# Patient Record
Sex: Female | Born: 1955 | Race: White | Hispanic: No | Marital: Single | Smoking: Former smoker
Health system: Southern US, Community
[De-identification: ages and names within clinical notes are randomized; demographics above are authoritative.]

## PROBLEM LIST (undated history)

## (undated) DIAGNOSIS — M1990 Unspecified osteoarthritis, unspecified site: Secondary | ICD-10-CM

## (undated) DIAGNOSIS — Z8719 Personal history of other diseases of the digestive system: Secondary | ICD-10-CM

## (undated) DIAGNOSIS — IMO0002 Reserved for concepts with insufficient information to code with codable children: Secondary | ICD-10-CM

## (undated) DIAGNOSIS — F32A Depression, unspecified: Secondary | ICD-10-CM

## (undated) DIAGNOSIS — I7410 Embolism and thrombosis of unspecified parts of aorta: Secondary | ICD-10-CM

## (undated) DIAGNOSIS — F329 Major depressive disorder, single episode, unspecified: Secondary | ICD-10-CM

## (undated) DIAGNOSIS — I10 Essential (primary) hypertension: Secondary | ICD-10-CM

## (undated) DIAGNOSIS — J40 Bronchitis, not specified as acute or chronic: Secondary | ICD-10-CM

## (undated) DIAGNOSIS — K219 Gastro-esophageal reflux disease without esophagitis: Secondary | ICD-10-CM

## (undated) DIAGNOSIS — D509 Iron deficiency anemia, unspecified: Secondary | ICD-10-CM

## (undated) DIAGNOSIS — G43909 Migraine, unspecified, not intractable, without status migrainosus: Secondary | ICD-10-CM

## (undated) DIAGNOSIS — F419 Anxiety disorder, unspecified: Secondary | ICD-10-CM

## (undated) DIAGNOSIS — T7840XA Allergy, unspecified, initial encounter: Secondary | ICD-10-CM

## (undated) DIAGNOSIS — D734 Cyst of spleen: Secondary | ICD-10-CM

## (undated) DIAGNOSIS — S3600XA Unspecified injury of spleen, initial encounter: Secondary | ICD-10-CM

## (undated) DIAGNOSIS — Z86718 Personal history of other venous thrombosis and embolism: Secondary | ICD-10-CM

## (undated) DIAGNOSIS — D473 Essential (hemorrhagic) thrombocythemia: Secondary | ICD-10-CM

## (undated) HISTORY — DX: Essential (hemorrhagic) thrombocythemia: D47.3

## (undated) HISTORY — DX: Iron deficiency anemia, unspecified: D50.9

---

## 2000-09-20 ENCOUNTER — Encounter: Admission: RE | Admit: 2000-09-20 | Discharge: 2000-09-20 | Payer: Self-pay | Admitting: Family Medicine

## 2000-09-20 ENCOUNTER — Encounter: Payer: Self-pay | Admitting: Family Medicine

## 2003-09-22 ENCOUNTER — Encounter: Admission: RE | Admit: 2003-09-22 | Discharge: 2003-09-22 | Payer: Self-pay | Admitting: Family Medicine

## 2007-03-27 ENCOUNTER — Encounter: Admission: RE | Admit: 2007-03-27 | Discharge: 2007-03-27 | Payer: Self-pay | Admitting: Family Medicine

## 2007-06-07 ENCOUNTER — Emergency Department (HOSPITAL_COMMUNITY): Admission: EM | Admit: 2007-06-07 | Discharge: 2007-06-08 | Payer: Self-pay | Admitting: Emergency Medicine

## 2007-09-18 ENCOUNTER — Ambulatory Visit (HOSPITAL_COMMUNITY): Admission: RE | Admit: 2007-09-18 | Discharge: 2007-09-18 | Payer: Self-pay | Admitting: Gastroenterology

## 2009-04-20 HISTORY — PX: KNEE ARTHROSCOPY: SHX127

## 2010-09-09 ENCOUNTER — Encounter: Payer: Self-pay | Admitting: Family Medicine

## 2010-09-10 ENCOUNTER — Encounter: Payer: Self-pay | Admitting: Family Medicine

## 2011-08-21 DIAGNOSIS — S3600XA Unspecified injury of spleen, initial encounter: Secondary | ICD-10-CM

## 2011-08-21 DIAGNOSIS — J40 Bronchitis, not specified as acute or chronic: Secondary | ICD-10-CM

## 2011-08-21 DIAGNOSIS — IMO0002 Reserved for concepts with insufficient information to code with codable children: Secondary | ICD-10-CM

## 2011-08-21 DIAGNOSIS — I7410 Embolism and thrombosis of unspecified parts of aorta: Secondary | ICD-10-CM

## 2011-08-21 DIAGNOSIS — Z86718 Personal history of other venous thrombosis and embolism: Secondary | ICD-10-CM

## 2011-08-21 HISTORY — DX: Unspecified injury of spleen, initial encounter: S36.00XA

## 2011-08-21 HISTORY — DX: Personal history of other venous thrombosis and embolism: Z86.718

## 2011-08-21 HISTORY — DX: Bronchitis, not specified as acute or chronic: J40

## 2011-08-21 HISTORY — DX: Embolism and thrombosis of unspecified parts of aorta: I74.10

## 2011-08-21 HISTORY — DX: Reserved for concepts with insufficient information to code with codable children: IMO0002

## 2011-08-28 ENCOUNTER — Emergency Department (HOSPITAL_COMMUNITY): Payer: BC Managed Care – PPO

## 2011-08-28 ENCOUNTER — Emergency Department (HOSPITAL_COMMUNITY)
Admission: EM | Admit: 2011-08-28 | Discharge: 2011-08-28 | Disposition: A | Discharge status: Home / Self Care | Payer: BC Managed Care – PPO | Attending: Emergency Medicine | Admitting: Emergency Medicine

## 2011-08-28 DIAGNOSIS — E871 Hypo-osmolality and hyponatremia: Secondary | ICD-10-CM

## 2011-08-28 DIAGNOSIS — R6883 Chills (without fever): Secondary | ICD-10-CM | POA: Insufficient documentation

## 2011-08-28 DIAGNOSIS — R5381 Other malaise: Secondary | ICD-10-CM | POA: Insufficient documentation

## 2011-08-28 DIAGNOSIS — Z79899 Other long term (current) drug therapy: Secondary | ICD-10-CM | POA: Insufficient documentation

## 2011-08-28 DIAGNOSIS — R51 Headache: Secondary | ICD-10-CM | POA: Insufficient documentation

## 2011-08-28 DIAGNOSIS — R739 Hyperglycemia, unspecified: Secondary | ICD-10-CM

## 2011-08-28 DIAGNOSIS — E119 Type 2 diabetes mellitus without complications: Secondary | ICD-10-CM | POA: Insufficient documentation

## 2011-08-28 DIAGNOSIS — R5383 Other fatigue: Secondary | ICD-10-CM | POA: Insufficient documentation

## 2011-08-28 DIAGNOSIS — J3489 Other specified disorders of nose and nasal sinuses: Secondary | ICD-10-CM | POA: Insufficient documentation

## 2011-08-28 DIAGNOSIS — R05 Cough: Secondary | ICD-10-CM | POA: Insufficient documentation

## 2011-08-28 DIAGNOSIS — M129 Arthropathy, unspecified: Secondary | ICD-10-CM | POA: Insufficient documentation

## 2011-08-28 DIAGNOSIS — R1110 Vomiting, unspecified: Secondary | ICD-10-CM | POA: Insufficient documentation

## 2011-08-28 DIAGNOSIS — R062 Wheezing: Secondary | ICD-10-CM | POA: Insufficient documentation

## 2011-08-28 DIAGNOSIS — R000 Tachycardia, unspecified: Secondary | ICD-10-CM | POA: Insufficient documentation

## 2011-08-28 DIAGNOSIS — E669 Obesity, unspecified: Secondary | ICD-10-CM | POA: Insufficient documentation

## 2011-08-28 DIAGNOSIS — R059 Cough, unspecified: Secondary | ICD-10-CM | POA: Insufficient documentation

## 2011-08-28 DIAGNOSIS — R0989 Other specified symptoms and signs involving the circulatory and respiratory systems: Secondary | ICD-10-CM | POA: Insufficient documentation

## 2011-08-28 DIAGNOSIS — J329 Chronic sinusitis, unspecified: Secondary | ICD-10-CM | POA: Insufficient documentation

## 2011-08-28 DIAGNOSIS — R0609 Other forms of dyspnea: Secondary | ICD-10-CM | POA: Insufficient documentation

## 2011-08-28 DIAGNOSIS — J40 Bronchitis, not specified as acute or chronic: Secondary | ICD-10-CM | POA: Insufficient documentation

## 2011-08-28 HISTORY — DX: Migraine, unspecified, not intractable, without status migrainosus: G43.909

## 2011-08-28 HISTORY — DX: Unspecified osteoarthritis, unspecified site: M19.90

## 2011-08-28 LAB — BASIC METABOLIC PANEL
CO2: 20 mEq/L (ref 19–32)
Chloride: 94 mEq/L — ABNORMAL LOW (ref 96–112)
Creatinine, Ser: 1.07 mg/dL (ref 0.50–1.10)
GFR calc Af Amer: 67 mL/min — ABNORMAL LOW (ref >90)
Potassium: 4.2 mEq/L (ref 3.5–5.1)
Sodium: 127 mEq/L — ABNORMAL LOW (ref 135–145)

## 2011-08-28 LAB — URINALYSIS, ROUTINE W REFLEX MICROSCOPIC
Bilirubin Urine: NEGATIVE
Glucose, UA: 100 mg/dL — AB
Ketones, ur: NEGATIVE mg/dL
Nitrite: NEGATIVE
Specific Gravity, Urine: 1.015 (ref 1.005–1.030)
pH: 5 (ref 5.0–8.0)

## 2011-08-28 LAB — DIFFERENTIAL
Basophils Absolute: 0 10*3/uL (ref 0.0–0.1)
Eosinophils Relative: 7 % — ABNORMAL HIGH (ref 0–5)
Lymphocytes Relative: 39 % (ref 12–46)
Lymphs Abs: 2.1 10*3/uL (ref 0.7–4.0)
Monocytes Absolute: 0.4 10*3/uL (ref 0.1–1.0)
Monocytes Relative: 7 % (ref 3–12)
Neutro Abs: 2.6 10*3/uL (ref 1.7–7.7)

## 2011-08-28 LAB — CBC
HCT: 42.5 % (ref 36.0–46.0)
Hemoglobin: 15.6 g/dL — ABNORMAL HIGH (ref 12.0–15.0)
MCV: 85.3 fL (ref 78.0–100.0)
RBC: 4.98 MIL/uL (ref 3.87–5.11)
WBC: 5.4 10*3/uL (ref 4.0–10.5)

## 2011-08-28 LAB — GLUCOSE, CAPILLARY: Glucose-Capillary: 294 mg/dL — ABNORMAL HIGH (ref 70–99)

## 2011-08-28 MED ORDER — ALBUTEROL SULFATE HFA 108 (90 BASE) MCG/ACT IN AERS: 2.0000 | INHALATION_SPRAY | RESPIRATORY_TRACT | Status: DC | PRN

## 2011-08-28 MED ORDER — ALBUTEROL SULFATE (5 MG/ML) 0.5% IN NEBU
2.5000 mg | INHALATION_SOLUTION | Freq: Once | RESPIRATORY_TRACT | Status: AC
  Administered 2011-08-28: 2.5 mg via RESPIRATORY_TRACT
  Filled 2011-08-28: qty 0.5

## 2011-08-28 MED ORDER — SODIUM CHLORIDE 0.9 % IV SOLN
Freq: Once | INTRAVENOUS | Status: AC
  Administered 2011-08-28: 17:00:00 via INTRAVENOUS

## 2011-08-28 NOTE — ED Notes (Signed)
Pt. Was sent to  [place] from [facility] clinic for dehydration and elevated blood sugars, also for n/v

## 2011-08-28 NOTE — ED Provider Notes (Signed)
History     CSN: [id number]  Arrival date & time 08/28/11  1341   First MD Initiated Contact with Patient 08/28/11 1558      Chief Complaint  Patient presents with  . Hyperglycemia    (Consider location/radiation/quality/duration/timing/severity/associated sxs/prior treatment) The history is provided by the patient.   56 year old female has been sick for the last 3 weeks with a cough which is mainly nonproductive and nasal congestion. She has not had any fever but she has had some chills. She's had posttussive emesis. During this time, she is so afraid fatigued and weak and she has noted some exertional dyspnea. She denies any nausea she denies vomiting without coughing paroxysms. Blood sugars have been running higher than normal during this time. She received a prescription for Bactrim which she has been taking for last 8 days with no relief. She went to her physician today and blood sugar was noted to be elevated and she was sent to the ED for IV fluids and a blood sugar control. Symptoms are moderate to severe. Nothing makes them better and nothing makes them worse. She arrived with paperwork from her physician's office and she had been written a prescription for Levaquin 750 mg for 5 days.  Past Medical History  Diagnosis Date  . Diabetes mellitus   . Arthritis   . Migraines     History reviewed. No pertinent past surgical history.  No family history on file.  History  Substance Use Topics  . Smoking status: Never Smoker   . Smokeless tobacco: Never Used  . Alcohol Use: No    OB History    Grav Para Term Preterm Abortions TAB SAB Ect Mult Living                  Review of Systems  All other systems reviewed and are negative.    Allergies  Codeine; Erythromycin; Lactose intolerance (gi); and Penicillins  Home Medications   Current Outpatient Rx  Name Route Sig Dispense Refill  . ALPRAZOLAM 0.5 MG PO TABS Oral Take 0.25 mg by mouth 2 (two) times daily as needed.  For anxiety     . BUPROPION HCL 75 MG PO TABS Oral Take 75 mg by mouth 2 (two) times daily.      . CHOLESTYRAMINE 4 GM/DOSE PO POWD Oral Take 4 g by mouth every morning.      Marland Kitchen DM-GUAIFENESIN ER 30-600 MG PO TB12 Oral Take 1 tablet by mouth every 12 (twelve) hours as needed. For cough     . ESOMEPRAZOLE MAGNESIUM 40 MG PO CPDR Oral Take 40 mg by mouth 2 (two) times daily.      Marland Kitchen GLIPIZIDE ER 2.5 MG PO TB24 Oral Take 2.5-5 mg by mouth 2 (two) times daily. 5 mg in the morning and 2.5 mg at night     . LISINOPRIL-HYDROCHLOROTHIAZIDE 20-12.5 MG PO TABS Oral Take 1 tablet by mouth every morning.      Marland Kitchen LOPERAMIDE HCL 2 MG PO CAPS Oral Take 2 mg by mouth once.      Marland Kitchen MEDROXYPROGESTERONE ACETATE 150 MG/ML IM SUSP Intramuscular Inject 150 mg into the muscle every 3 (three) months.      Marland Kitchen OVER THE COUNTER MEDICATION Both Eyes Place 2 drops into both eyes daily as needed. Saline drop for dry eyes     . SULFAMETHOXAZOLE-TMP DS 800-160 MG PO TABS Oral Take 1 tablet by mouth 2 (two) times daily. For 12 days - started 12/31     .  TRAZODONE HCL 100 MG PO TABS Oral Take 100 mg by mouth at bedtime.      . VENLAFAXINE HCL ER 75 MG PO CP24 Oral Take 75 mg by mouth every evening.        BP 117/70  Pulse 103  Temp(Src) 98.4 F (36.9 C) (Oral)  Resp 20  Ht 5\' 6"  (1.676 m)  Wt 238 lb (107.956 kg)  BMI 38.41 kg/m2  SpO2 95%  Physical Exam  Nursing note and vitals reviewed.  56 year old female is resting comfortably and in no acute distress. No signs show mild tachycardia with heart rate 103. Oxygen saturation is 95% which is normal. She is a mildly obese. Head is normocephalic and atraumatic. PERRLA, EOMI. There is moderate tenderness palpation over frontal maxillary sinuses. Mucous membranes are dry. Pharynx is clear. Neck is supple without adenopathy or JVD. Back is nontender. Lungs have scattered wheezes and a wheezy cough present. No rales or rhonchi are heard. Heart has regular rate rhythm without murmur.  There is no chest wall tenderness. Abdomen is soft, flat, nontender without masses or hepatosplenomegaly. Extremities have no cyanosis or edema, full range of motion is present. Skin is warm and moist without rash. Neurologic: Mental status is normal, cranial nerves are intact, there no focal motor or sensory deficits. Psychiatric: No abnormalities of mood or affect.  ED Course  Procedures (including critical care time)  Labs Reviewed  GLUCOSE, CAPILLARY - Abnormal; Notable for the following:    Glucose-Capillary 294 (*)    All other components within normal limits  POCT CBG MONITORING  BASIC METABOLIC PANEL  CBC  DIFFERENTIAL  URINALYSIS, ROUTINE W REFLEX MICROSCOPIC   No results found. Results for orders placed during the hospital encounter of 08/28/11  GLUCOSE, CAPILLARY      Component Value Range   Glucose-Capillary 294 (*) 70 - 99 (mg/dL)  BASIC METABOLIC PANEL      Component Value Range   Sodium 127 (*) 135 - 145 (mEq/L)   Potassium 4.2  3.5 - 5.1 (mEq/L)   Chloride 94 (*) 96 - 112 (mEq/L)   CO2 20  19 - 32 (mEq/L)   Glucose, Bld 245 (*) 70 - 99 (mg/dL)   BUN 17  6 - 23 (mg/dL)   Creatinine, Ser 4.09  0.50 - 1.10 (mg/dL)   Calcium 8.8  8.4 - 81.1 (mg/dL)   GFR calc non Af Amer 58 (*) >90 (mL/min)   GFR calc Af Amer 67 (*) >90 (mL/min)  CBC      Component Value Range   WBC 5.4  4.0 - 10.5 (K/uL)   RBC 4.98  3.87 - 5.11 (MIL/uL)   Hemoglobin 15.6 (*) 12.0 - 15.0 (g/dL)   HCT 91.4  78.2 - 95.6 (%)   MCV 85.3  78.0 - 100.0 (fL)   MCH 31.3  26.0 - 34.0 (pg)   MCHC 36.7 (*) 30.0 - 36.0 (g/dL)   RDW 21.3  08.6 - 57.8 (%)   Platelets 175  150 - 400 (K/uL)  DIFFERENTIAL      Component Value Range   Neutrophils Relative 48  43 - 77 (%)   Neutro Abs 2.6  1.7 - 7.7 (K/uL)   Lymphocytes Relative 39  12 - 46 (%)   Lymphs Abs 2.1  0.7 - 4.0 (K/uL)   Monocytes Relative 7  3 - 12 (%)   Monocytes Absolute 0.4  0.1 - 1.0 (K/uL)   Eosinophils Relative 7 (*) 0 - 5 (%)  Eosinophils Absolute 0.4  0.0 - 0.7 (K/uL)   Basophils Relative 0  0 - 1 (%)   Basophils Absolute 0.0  0.0 - 0.1 (K/uL)  URINALYSIS, ROUTINE W REFLEX MICROSCOPIC      Component Value Range   Color, Urine YELLOW  YELLOW    APPearance HAZY (*) CLEAR    Specific Gravity, Urine 1.015  1.005 - 1.030    pH 5.0  5.0 - 8.0    Glucose, UA 100 (*) NEGATIVE (mg/dL)   Hgb urine dipstick NEGATIVE  NEGATIVE    Bilirubin Urine NEGATIVE  NEGATIVE    Ketones, ur NEGATIVE  NEGATIVE (mg/dL)   Protein, ur NEGATIVE  NEGATIVE (mg/dL)   Urobilinogen, UA 0.2  0.0 - 1.0 (mg/dL)   Nitrite NEGATIVE  NEGATIVE    Leukocytes, UA NEGATIVE  NEGATIVE   GLUCOSE, CAPILLARY      Component Value Range   Glucose-Capillary 259 (*) 70 - 99 (mg/dL)   Comment 1 Documented in Chart     Comment 2 Notify RN     Dg Chest 2 View  08/28/2011  *RADIOLOGY REPORT*  Clinical Data: Flu symptoms, cough, vomiting  CHEST - 2 VIEW  Comparison: None.  Findings: Normal heart size and vascularity.  Slight diffuse interstitial prominence, nonspecific.  Negative for edema, pneumonia, collapse, consolidation, effusion or pneumothorax. Trachea midline.  Mild thoracic spondylosis.  IMPRESSION: Nonspecific interstitial prominence.  No acute chest process  Original Report Authenticated By: Judie Petit. Ruel Favors, M.D.      No diagnosis found.  Laboratory workup does not show any evidence of severe hyperglycemia or dehydration. She has mild hyponatremia which is not clinically significant. She was given IV hydration and an albuterol nebulizer treatment and feels considerably better. She'll be discharged with a prescription for an albuterol inhaler, and she is to get her antibiotic prescription filled. She is advised that sinus infections sometimes require longer courses of antibiotics.  Impression: Sinusitis. Bronchitis. Diabetes mellitus.  MDM  Respiratory tract infection with probable sinusitis and possible pneumonia. Hyperglycemia secondary to  infection.        Dione Booze, MD 08/28/11 2019

## 2011-08-28 NOTE — ED Notes (Signed)
[id number] 

## 2011-08-28 NOTE — ED Notes (Signed)
Returned from xray

## 2011-08-28 NOTE — ED Notes (Signed)
C/o fatigue and weakness increasing over past 2 days. Pt states having cold sx x 3 weeks and finishing a course of abx pta. Denies sob/cp. Primary complaint is weakness. Pt sent here by [facility] for eval of dehydration. Pt in nad.

## 2011-09-04 ENCOUNTER — Other Ambulatory Visit: Payer: Self-pay

## 2011-09-04 ENCOUNTER — Inpatient Hospital Stay (HOSPITAL_COMMUNITY)
Admission: EM | Admit: 2011-09-04 | Discharge: 2011-09-10 | DRG: 569 | Disposition: A | Discharge status: Home / Self Care | Payer: BC Managed Care – PPO | Attending: Internal Medicine | Admitting: Internal Medicine

## 2011-09-04 ENCOUNTER — Emergency Department (HOSPITAL_COMMUNITY): Payer: BC Managed Care – PPO

## 2011-09-04 ENCOUNTER — Encounter (HOSPITAL_COMMUNITY): Payer: Self-pay

## 2011-09-04 DIAGNOSIS — Z888 Allergy status to other drugs, medicaments and biological substances status: Secondary | ICD-10-CM

## 2011-09-04 DIAGNOSIS — D7389 Other diseases of spleen: Secondary | ICD-10-CM | POA: Diagnosis present

## 2011-09-04 DIAGNOSIS — N2889 Other specified disorders of kidney and ureter: Principal | ICD-10-CM | POA: Diagnosis present

## 2011-09-04 DIAGNOSIS — Z880 Allergy status to penicillin: Secondary | ICD-10-CM

## 2011-09-04 DIAGNOSIS — I712 Thoracic aortic aneurysm, without rupture, unspecified: Secondary | ICD-10-CM | POA: Diagnosis present

## 2011-09-04 DIAGNOSIS — F419 Anxiety disorder, unspecified: Secondary | ICD-10-CM | POA: Insufficient documentation

## 2011-09-04 DIAGNOSIS — F341 Dysthymic disorder: Secondary | ICD-10-CM | POA: Diagnosis present

## 2011-09-04 DIAGNOSIS — D6859 Other primary thrombophilia: Secondary | ICD-10-CM | POA: Diagnosis present

## 2011-09-04 DIAGNOSIS — N280 Ischemia and infarction of kidney: Secondary | ICD-10-CM | POA: Diagnosis present

## 2011-09-04 DIAGNOSIS — R112 Nausea with vomiting, unspecified: Secondary | ICD-10-CM | POA: Insufficient documentation

## 2011-09-04 DIAGNOSIS — F17200 Nicotine dependence, unspecified, uncomplicated: Secondary | ICD-10-CM | POA: Diagnosis present

## 2011-09-04 DIAGNOSIS — F329 Major depressive disorder, single episode, unspecified: Secondary | ICD-10-CM | POA: Insufficient documentation

## 2011-09-04 DIAGNOSIS — E119 Type 2 diabetes mellitus without complications: Secondary | ICD-10-CM | POA: Diagnosis present

## 2011-09-04 DIAGNOSIS — I959 Hypotension, unspecified: Secondary | ICD-10-CM

## 2011-09-04 DIAGNOSIS — R1012 Left upper quadrant pain: Secondary | ICD-10-CM

## 2011-09-04 DIAGNOSIS — I8290 Acute embolism and thrombosis of unspecified vein: Secondary | ICD-10-CM | POA: Diagnosis present

## 2011-09-04 DIAGNOSIS — K55059 Acute (reversible) ischemia of intestine, part and extent unspecified: Secondary | ICD-10-CM | POA: Diagnosis present

## 2011-09-04 DIAGNOSIS — D735 Infarction of spleen: Secondary | ICD-10-CM | POA: Diagnosis present

## 2011-09-04 DIAGNOSIS — K219 Gastro-esophageal reflux disease without esophagitis: Secondary | ICD-10-CM | POA: Diagnosis present

## 2011-09-04 DIAGNOSIS — R109 Unspecified abdominal pain: Secondary | ICD-10-CM | POA: Diagnosis present

## 2011-09-04 DIAGNOSIS — Z881 Allergy status to other antibiotic agents status: Secondary | ICD-10-CM

## 2011-09-04 DIAGNOSIS — I700 Atherosclerosis of aorta: Secondary | ICD-10-CM

## 2011-09-04 DIAGNOSIS — T783XXA Angioneurotic edema, initial encounter: Secondary | ICD-10-CM | POA: Insufficient documentation

## 2011-09-04 DIAGNOSIS — Z720 Tobacco use: Secondary | ICD-10-CM

## 2011-09-04 DIAGNOSIS — I7411 Embolism and thrombosis of thoracic aorta: Secondary | ICD-10-CM | POA: Diagnosis present

## 2011-09-04 HISTORY — DX: Anxiety disorder, unspecified: F41.9

## 2011-09-04 HISTORY — DX: Depression, unspecified: F32.A

## 2011-09-04 HISTORY — DX: Allergy, unspecified, initial encounter: T78.40XA

## 2011-09-04 HISTORY — DX: Gastro-esophageal reflux disease without esophagitis: K21.9

## 2011-09-04 HISTORY — DX: Major depressive disorder, single episode, unspecified: F32.9

## 2011-09-04 HISTORY — DX: Essential (primary) hypertension: I10

## 2011-09-04 LAB — URINALYSIS, ROUTINE W REFLEX MICROSCOPIC
Hgb urine dipstick: NEGATIVE
Nitrite: NEGATIVE
Specific Gravity, Urine: 1.024 (ref 1.005–1.030)
Urobilinogen, UA: 0.2 mg/dL (ref 0.0–1.0)

## 2011-09-04 LAB — CULTURE, BLOOD (ROUTINE X 2): Culture  Setup Time: 201301160147

## 2011-09-04 LAB — DIFFERENTIAL
Basophils Absolute: 0 10*3/uL (ref 0.0–0.1)
Basophils Relative: 0 % (ref 0–1)
Lymphocytes Relative: 7 % — ABNORMAL LOW (ref 12–46)
Monocytes Relative: 1 % — ABNORMAL LOW (ref 3–12)
Neutro Abs: 7.9 10*3/uL — ABNORMAL HIGH (ref 1.7–7.7)
Neutrophils Relative %: 91 % — ABNORMAL HIGH (ref 43–77)

## 2011-09-04 LAB — LACTIC ACID, PLASMA: Lactic Acid, Venous: 5.6 mmol/L — ABNORMAL HIGH (ref 0.5–2.2)

## 2011-09-04 LAB — COMPREHENSIVE METABOLIC PANEL
ALT: 11 U/L (ref 0–35)
AST: 18 U/L (ref 0–37)
Calcium: 8.9 mg/dL (ref 8.4–10.5)
GFR calc Af Amer: 59 mL/min — ABNORMAL LOW (ref >90)
Glucose, Bld: 178 mg/dL — ABNORMAL HIGH (ref 70–99)
Sodium: 140 mEq/L (ref 135–145)
Total Protein: 6.4 g/dL (ref 6.0–8.3)

## 2011-09-04 LAB — CBC
HCT: 42.8 % (ref 36.0–46.0)
Hemoglobin: 15 g/dL (ref 12.0–15.0)
MCHC: 35 g/dL (ref 30.0–36.0)
RDW: 12.7 % (ref 11.5–15.5)
WBC: 8.7 10*3/uL (ref 4.0–10.5)

## 2011-09-04 LAB — CARDIAC PANEL(CRET KIN+CKTOT+MB+TROPI)
CK, MB: 3.3 ng/mL (ref 0.3–4.0)
Total CK: 58 U/L (ref 7–177)

## 2011-09-04 LAB — URINE MICROSCOPIC-ADD ON

## 2011-09-04 MED ORDER — GI COCKTAIL ~~LOC~~
30.0000 mL | Freq: Once | ORAL | Status: AC
  Administered 2011-09-04: 30 mL via ORAL
  Filled 2011-09-04: qty 30

## 2011-09-04 MED ORDER — FENTANYL CITRATE 0.05 MG/ML IJ SOLN
25.0000 ug | Freq: Once | INTRAMUSCULAR | Status: AC
  Administered 2011-09-04: 25 ug via INTRAVENOUS

## 2011-09-04 MED ORDER — DIPHENHYDRAMINE HCL 50 MG/ML IJ SOLN
25.0000 mg | Freq: Once | INTRAMUSCULAR | Status: AC
  Administered 2011-09-04: 25 mg via INTRAVENOUS
  Filled 2011-09-04: qty 1

## 2011-09-04 MED ORDER — METHYLPREDNISOLONE SODIUM SUCC 125 MG IJ SOLR
125.0000 mg | Freq: Once | INTRAMUSCULAR | Status: AC
  Administered 2011-09-04: 125 mg via INTRAVENOUS
  Filled 2011-09-04: qty 2

## 2011-09-04 MED ORDER — ONDANSETRON HCL 4 MG/2ML IJ SOLN
4.0000 mg | Freq: Once | INTRAMUSCULAR | Status: AC
  Administered 2011-09-04: 4 mg via INTRAVENOUS
  Filled 2011-09-04: qty 2

## 2011-09-04 MED ORDER — IOHEXOL 300 MG/ML  SOLN
100.0000 mL | Freq: Once | INTRAMUSCULAR | Status: AC | PRN
  Administered 2011-09-04: 100 mL via INTRAVENOUS

## 2011-09-04 MED ORDER — SODIUM CHLORIDE 0.9 % IV SOLN
INTRAVENOUS | Status: DC
  Administered 2011-09-04: 21:00:00 via INTRAVENOUS

## 2011-09-04 MED ORDER — SODIUM CHLORIDE 0.9 % IV BOLUS (SEPSIS)
1000.0000 mL | Freq: Once | INTRAVENOUS | Status: AC
  Administered 2011-09-04: 1000 mL via INTRAVENOUS

## 2011-09-04 MED ORDER — FAMOTIDINE IN NACL 20-0.9 MG/50ML-% IV SOLN
20.0000 mg | Freq: Once | INTRAVENOUS | Status: AC
  Administered 2011-09-04: 20 mg via INTRAVENOUS
  Filled 2011-09-04: qty 50

## 2011-09-04 MED ORDER — FENTANYL CITRATE 0.05 MG/ML IJ SOLN
25.0000 ug | Freq: Once | INTRAMUSCULAR | Status: AC
Start: 2011-09-04 — End: 2011-09-04
  Administered 2011-09-04: 25 ug via INTRAVENOUS

## 2011-09-04 MED ORDER — HEPARIN BOLUS VIA INFUSION
5000.0000 [IU] | Freq: Once | INTRAVENOUS | Status: AC
  Administered 2011-09-04: 5000 [IU] via INTRAVENOUS
  Filled 2011-09-04: qty 5000

## 2011-09-04 MED ORDER — HEPARIN SOD (PORCINE) IN D5W 100 UNIT/ML IV SOLN
1400.0000 [IU]/h | INTRAVENOUS | Status: AC
  Administered 2011-09-04: 1400 [IU]/h via INTRAVENOUS
  Filled 2011-09-04 (×2): qty 250

## 2011-09-04 MED ORDER — MORPHINE SULFATE 4 MG/ML IJ SOLN
4.0000 mg | Freq: Once | INTRAMUSCULAR | Status: AC
  Administered 2011-09-04: 4 mg via INTRAVENOUS

## 2011-09-04 MED ORDER — FENTANYL CITRATE 0.05 MG/ML IJ SOLN
25.0000 ug | Freq: Once | INTRAMUSCULAR | Status: AC
  Administered 2011-09-04: 25 ug via INTRAVENOUS
  Filled 2011-09-04: qty 2

## 2011-09-04 MED ORDER — METOCLOPRAMIDE HCL 5 MG/ML IJ SOLN
10.0000 mg | Freq: Once | INTRAMUSCULAR | Status: AC
  Administered 2011-09-04: 10 mg via INTRAVENOUS
  Filled 2011-09-04: qty 2

## 2011-09-04 MED ORDER — MORPHINE SULFATE 4 MG/ML IJ SOLN
INTRAMUSCULAR | Status: AC
  Administered 2011-09-04: 4 mg via INTRAVENOUS
  Filled 2011-09-04: qty 1

## 2011-09-04 NOTE — ED Notes (Signed)
Pt ambulated to RR with NA for CCUS.

## 2011-09-04 NOTE — ED Notes (Signed)
Pt continues in RR, pt having nausea and dry heaving.

## 2011-09-04 NOTE — ED Notes (Signed)
Patient transported to CT 

## 2011-09-04 NOTE — ED Provider Notes (Addendum)
History     CSN: [id number]  Arrival date & time 09/04/11  1452   First MD Initiated Contact with Patient 09/04/11 1504      Chief Complaint  Patient presents with  . Abdominal Pain    (Consider location/radiation/quality/duration/timing/severity/associated sxs/prior treatment) HPI  H/o DM, migraines pw abdominal pain. She states that just prior to arrival at work she took a Celebrex. With the first time that she had a medication. She states she became diffusely dizzy and anxious. She felt like her throat was swelling at that time. She took 50 mg of Benadryl and was also given IV Zantac and Zofran as she works at the [employer]. He began to complain of epigastric abdominal pain. Her blood pressure was noted to be systolic in the 80s and she was transported to the emergency department for further evaluation and workup. Patient states that she's had multiple episodes of nonbilious nonbloody emesis. She's also had persistant diarrhea for about 30 minutes now. She continues to complain of epigastric pain which she rates as a 10 out of 10 at this time. The pain radiates to her back. No history of similar. No history of aortic aneurysm. No history of pancreatitis. Patient states that she has similar pain when she's "really hungry".   ED Notes, ED Provider Notes       Kathe Becton, RN 09/04/2011 14:58      Pt. Took a Celebrex at work, first time , became very itchy And anxious. She received Benadryl 50 mg, Iv Zantac 50 mg, She became nauseated and received Iv Zofran 4mg , She works at [employer] [place], The IV and medication was given there. After receiving the Zofran she reported severe ABdominal Pain, and started vomiting, The surgical center felt they could not send her home and they decided to transport her to [facility],. Pt. Is pale, alert and oriented X3, continues to have epigastric pain,      Past Medical History  Diagnosis Date  . Diabetes mellitus   . Arthritis     . Migraines     History reviewed. No pertinent past surgical history.  History reviewed. No pertinent family history.  History  Substance Use Topics  . Smoking status: Never Smoker   . Smokeless tobacco: Never Used  . Alcohol Use: No    OB History    Grav Para Term Preterm Abortions TAB SAB Ect Mult Living                  Review of Systems  All other systems reviewed and are negative.   except as noted HPI  Allergies  Celebrex; Codeine; Erythromycin; Lactose intolerance (gi); and Penicillins  Home Medications   Current Outpatient Rx  Name Route Sig Dispense Refill  . ALBUTEROL SULFATE HFA 108 (90 BASE) MCG/ACT IN AERS Inhalation Inhale 2 puffs into the lungs every 4 (four) hours as needed for wheezing (cough). 1 Inhaler 0  . ALPRAZOLAM 0.5 MG PO TABS Oral Take 0.25 mg by mouth 2 (two) times daily as needed. For anxiety     . BUPROPION HCL 75 MG PO TABS Oral Take 75 mg by mouth 2 (two) times daily.      . CELECOXIB 200 MG PO CAPS Oral Take 200 mg by mouth 2 (two) times daily.    . CHOLESTYRAMINE 4 GM/DOSE PO POWD Oral Take 4 g by mouth every morning.      Marland Kitchen DM-GUAIFENESIN ER 30-600 MG PO TB12 Oral Take 1  tablet by mouth every 12 (twelve) hours as needed. For cough     . ESOMEPRAZOLE MAGNESIUM 40 MG PO CPDR Oral Take 40 mg by mouth 2 (two) times daily.      Marland Kitchen GLIPIZIDE ER 2.5 MG PO TB24 Oral Take 2.5-5 mg by mouth 2 (two) times daily. 5 mg in the morning and 2.5 mg at night     . LISINOPRIL-HYDROCHLOROTHIAZIDE 20-12.5 MG PO TABS Oral Take 1 tablet by mouth every morning.      Marland Kitchen LOPERAMIDE HCL 2 MG PO CAPS Oral Take 2 mg by mouth once.      Marland Kitchen MEDROXYPROGESTERONE ACETATE 150 MG/ML IM SUSP Intramuscular Inject 150 mg into the muscle every 3 (three) months.      Marland Kitchen OVER THE COUNTER MEDICATION Both Eyes Place 2 drops into both eyes daily as needed. Saline drop for dry eyes     . TRAZODONE HCL 100 MG PO TABS Oral Take 100 mg by mouth at bedtime.      . VENLAFAXINE HCL ER 75 MG  PO CP24 Oral Take 75 mg by mouth every evening.      . SULFAMETHOXAZOLE-TMP DS 800-160 MG PO TABS Oral Take 1 tablet by mouth 2 (two) times daily. For 12 days - started 12/31       BP 96/68  Pulse 121  Temp(Src) 97.9 F (36.6 C) (Oral)  Resp 24  SpO2 97%  Physical Exam  Nursing note and vitals reviewed. Constitutional: She is oriented to person, place, and time. She appears well-developed.       Appears to be in pain Writhing in pain  HENT:  Head: Atraumatic.       Mm dry  Eyes: Conjunctivae and EOM are normal. Pupils are equal, round, and reactive to light.  Neck: Normal range of motion. Neck supple.  Cardiovascular: Normal rate, regular rhythm, normal heart sounds and intact distal pulses.   Pulmonary/Chest: Effort normal and breath sounds normal. No respiratory distress. She has no wheezes. She has no rales.  Abdominal: Soft. She exhibits no distension. There is tenderness. There is no rebound and no guarding.       +epigastric ttp  Musculoskeletal: Normal range of motion. She exhibits no edema.  Neurological: She is alert and oriented to person, place, and time.  Skin: Skin is warm and dry. No rash noted.  Psychiatric:       Appears anxious    Unable to perform bedside U/S as patient writhing in pain   Date: 09/04/2011  Rate: 114  Rhythm: sinus tachycardia  QRS Axis: normal  Intervals: normal  ST/T Wave abnormalities: normal  Conduction Disutrbances:none  Narrative Interpretation: sinus tachycardia  Old EKG Reviewed: no significant change. now tachycardic    ED Course  Procedures (including critical care time)  Labs Reviewed  DIFFERENTIAL - Abnormal; Notable for the following:    Neutrophils Relative 91 (*)    Neutro Abs 7.9 (*)    Lymphocytes Relative 7 (*)    Lymphs Abs 0.6 (*)    Monocytes Relative 1 (*)    All other components within normal limits  COMPREHENSIVE METABOLIC PANEL - Abnormal; Notable for the following:    Glucose, Bld 178 (*)     Creatinine, Ser 1.20 (*)    Albumin 3.4 (*)    GFR calc non Af Amer 51 (*)    GFR calc Af Amer 59 (*)    All other components within normal limits  URINALYSIS, ROUTINE W REFLEX MICROSCOPIC - Abnormal;  Notable for the following:    APPearance CLOUDY (*)    Glucose, UA >1000 (*)    All other components within normal limits  LACTIC ACID, PLASMA - Abnormal; Notable for the following:    Lactic Acid, Venous 5.6 (*)    All other components within normal limits  GLUCOSE, CAPILLARY - Abnormal; Notable for the following:    Glucose-Capillary 160 (*)    All other components within normal limits  URINE MICROSCOPIC-ADD ON - Abnormal; Notable for the following:    Squamous Epithelial / LPF FEW (*)    All other components within normal limits  CBC  LIPASE, BLOOD  CARDIAC PANEL(CRET KIN+CKTOT+MB+TROPI)  PRO B NATRIURETIC PEPTIDE  POCT CBG MONITORING  CULTURE, BLOOD (ROUTINE X 2)  CULTURE, BLOOD (ROUTINE X 2)   Dg Chest Portable 1 View  09/04/2011  *RADIOLOGY REPORT*  Clinical Data: Rule out free air  PORTABLE CHEST - 1 VIEW  Comparison: None  Findings: The heart size and mediastinal contours are within normal limits.  Both lungs are clear.  The visualized skeletal structures are unremarkable. No lucencies identified in the projection of the upper abdomen.  IMPRESSION: 1.  No acute cardiopulmonary abnormalities. 2.  Exam insufficient for ruling out "free air."  Recommend upright PA radiograph of the chest as well as dedicated abdominal series.  Original Report Authenticated By: Rosealee Albee, M.D.   Ct Angio Abd/pel W/ And/or W/o  09/04/2011  *RADIOLOGY REPORT*  Clinical Data:  Severe abdominal pain, nausea, vomiting, diarrhea  CT ANGIOGRAPHY ABDOMEN AND PELVIS  Technique:  Multidetector CT imaging of the abdomen and pelvis was performed using the standard protocol during bolus administration of intravenous contrast.  Multiplanar reconstructed images including MIPs were obtained and reviewed to  evaluate the vascular anatomy.  Contrast: OMNIPAQUE IOHEXOL 300 MG/ML IV SOLN  Comparison:  09/18/2007 abdominal ultrasound  Findings:  Lower thoracic aorta demonstrates a small amount of hypodense eccentric wall plaque formation versus nonocclusive thrombus, image #2.  Otherwise the lower thoracic aorta is patent. Abdominal aorta demonstrates mild atherosclerotic changes without aneurysm or dissection.  No acute retroperitoneal hemorrhage.  Celiac origin is minimally narrowed by a superior aspect noncalcified plaque formation.  Degree of celiac origin narrowing is less than 50%.  Celiac branches remain patent.  SMA origin is widely patent.  SMA main ileocolic trunk is visualized and patent supplying jejunal and colic branches throughout the mesentery.  Exam is limited with some respiratory motion artifact through the abdomen.  Infrarenal abdominal aorta demonstrates anterior hypodense noncalcified plaque formation or thrombus narrowing the aortic lumen approximately 50%, image 89.  This extends to the IMA origin which is not well visualized however the IMA itself is patent distally.  Aortic bifurcation is patent.  Pelvic iliac vessels demonstrate mild atherosclerosis but are patent.  Additional axial imaging:  Liver demonstrates no focal hepatic abnormality. Hepatic and portal veins are patent.  The gallbladder, biliary system, pancreas, adrenal glands, and left kidney within normal limits and demonstrate no acute finding.  Spleen demonstrates a patchy enhancement pattern even on the portal venous phase imaging.  This appears somewhat wedge-shaped and peripheral. Areas of multi focal splenic infarction could have this appearance possibly from an embolic process.  The tortuous main splenic artery appears patent.  Right kidney demonstrates a lower pole wedge shaped defect extending to the cortical margin, image 36 series 7 also suspicious for a renal infarct, possibly embolic.  Renal arteries both remain  patent.  Negative for bowel obstruction,  significant dilatation, ileus or free air. No visualized bowel wall thickening.  colon is relatively decompressed.  Cecum is low lying in the pelvis.  Normal appendix identified containing air.  No abdominal or pelvic free fluid, fluid collection, hemorrhage, hematoma, adenopathy, inguinal abnormality, or hernia.  Mild diffuse degenerative changes of the spine.  No compression fracture.   Review of the MIP images confirms the above findings.  IMPRESSION: Heterogeneous enhancing pattern of the spleen which persist on the portal venous phase delayed imaging suspicious for multi focal splenic infarcts.  Wedge shaped defect right kidney lower pole suspicious for a renal infarct.  Finding suggest a possible embolic phenomenon.  Small nonocclusive noncalcified plaque formation or thrombus in the lower thoracic aorta and to a more pronounced degree in the infrarenal abdominal aorta.  Slight narrowing of the celiac origin however the celiac, SMA and IMA appear patent.  Single renal arteries appear patent as well.  No CT evidence of bowel obstruction, bowel wall thickening, pneumatosis, or portal venous gas  Original Report Authenticated By: Judie Petit. Ruel Favors, M.D.    1. Splenic infarct   2. Renal infarct   3. Hypotension     MDM  Patient with possible allergic reaction. Airway intact. NS bolus, IVF, benadryl, pepcid steroids. Severe abdominal pain, N/V/diarrhea, hypotension. Question bowel edema v/s acute intra-abdominal process. Unable to perform bedside fast and aorta scan. Will attempt again after pain control. SBP 108 at this time. Fentanyl. Upright CXR. CT A/P ordered. Reassess. CT changed to CTA given elevated lactic acid   Concern for multiple emboli on CT A/P. DW Vascular surgery who will consult. Arteries are patent at this time. Will d/W PCCM for admit to ICU given persistent hypotension and tachycardia. Discussed with patient.  D/W PCCM-- to evaluate patient in  the ED.  D/W Vascular surgery. Heparin ordered.        Forbes Cellar, MD 09/04/11 1943  Forbes Cellar, MD 09/04/11 2017

## 2011-09-04 NOTE — Consult Note (Signed)
[facility]  HISTORY AND PHYSICAL   Requesting: [facility] Reason for consult: embolus kidney spleen with aortic thrombus  History of Present Illness:  Patient is a 56 y.o. year old female who presents for evaluation of abdominal pain.  Other medical problems include diabetes, smoking, arthritis.  The patient has had flu like upper respiratory symptoms for 7-10 days.  She has had nasal discharge and scratchy throat.  She was put on Bactrim with no real relief.  Also recently started Celebrex for arthritis.  Approximately 7-8 hrs ago had fairly sudden onset of upper abdominal pain.  The pain is improved with eating liquids or solids.  Currently no nausea or vomiting.  Had some diarrhea earlier.  Pain is dull in character.  Has really not really changed over the past few hours.  Denies claudication.  No prior similar events.  No history of hypercoag state. Smokes 1/2 ppd for about 40 yrs.  Apparently some hypotension in ER.  Past Medical History  Diagnosis Date  . Diabetes mellitus   . Arthritis   . Migraines     History reviewed. No pertinent past surgical history.   Social History History  Substance Use Topics  . Smoking status: Never Smoker   . Smokeless tobacco: Never Used  . Alcohol Use: No    Family History History reviewed. No pertinent family history.  Allergies  Allergies  Allergen Reactions  . Celebrex (Celecoxib) Anaphylaxis  . Codeine Other (See Comments)    Violently ill  . Erythromycin Nausea And Vomiting  . Lactose Intolerance (Gi) Other (See Comments)    GI upset  . Penicillins Nausea Only     Current Facility-Administered Medications  Medication Dose Route Frequency Provider Last Rate Last Dose  . diphenhydrAMINE (BENADRYL) injection 25 mg  25 mg Intravenous Once Forbes Cellar, MD   25 mg at 09/04/11 1708  . famotidine (PEPCID) IVPB 20 mg  20 mg Intravenous Once Forbes Cellar, MD   20 mg at 09/04/11 1531  . fentaNYL  (SUBLIMAZE) injection 25 mcg  25 mcg Intravenous Once Forbes Cellar, MD   25 mcg at 09/04/11 1606  . fentaNYL (SUBLIMAZE) injection 25 mcg  25 mcg Intravenous Once Forbes Cellar, MD   25 mcg at 09/04/11 1735  . fentaNYL (SUBLIMAZE) injection 25 mcg  25 mcg Intravenous Once Forbes Cellar, MD   25 mcg at 09/04/11 1849  . fentaNYL (SUBLIMAZE) injection 25 mcg  25 mcg Intravenous Once Forbes Cellar, MD   25 mcg at 09/04/11 1956  . heparin ADULT infusion 100 units/ml (25000 units/250 ml)  1,400 Units/hr Intravenous To Major [facility]      . heparin bolus via infusion 5,000 Units  5,000 Units Intravenous Once [facility], PHARMD      . iohexol (OMNIPAQUE) 300 MG/ML solution 100 mL  100 mL Intravenous Once PRN Medication Radiologist   100 mL at 09/04/11 1801  . methylPREDNISolone sodium succinate (SOLU-MEDROL) 125 MG injection 125 mg  125 mg Intravenous Once Forbes Cellar, MD   125 mg at 09/04/11 1559  . metoCLOPramide (REGLAN) injection 10 mg  10 mg Intravenous Once Forbes Cellar, MD   10 mg at 09/04/11 1701  . ondansetron (ZOFRAN) injection 4 mg  4 mg Intravenous Once Forbes Cellar, MD   4 mg at 09/04/11 1617  . sodium chloride 0.9 % bolus 1,000 mL  1,000 mL Intravenous Once Forbes Cellar, MD   1,000 mL at 09/04/11 1529  .  sodium chloride 0.9 % bolus 1,000 mL  1,000 mL Intravenous Once Forbes Cellar, MD   1,000 mL at 09/04/11 1848  . sodium chloride 0.9 % bolus 1,000 mL  1,000 mL Intravenous Once Forbes Cellar, MD   1,000 mL at 09/04/11 1848   Current Outpatient Prescriptions  Medication Sig Dispense Refill  . albuterol (PROVENTIL HFA;VENTOLIN HFA) 108 (90 BASE) MCG/ACT inhaler Inhale 2 puffs into the lungs every 4 (four) hours as needed for wheezing (cough).  1 Inhaler  0  . ALPRAZolam (XANAX) 0.5 MG tablet Take 0.25 mg by mouth 2 (two) times daily as needed. For anxiety       . buPROPion (WELLBUTRIN) 75 MG tablet Take 75 mg by mouth 2 (two) times daily.         . celecoxib (CELEBREX) 200 MG capsule Take 200 mg by mouth 2 (two) times daily.      . cholestyramine (QUESTRAN) 4 GM/DOSE powder Take 4 g by mouth every morning.        Marland Kitchen dextromethorphan-guaiFENesin (MUCINEX DM) 30-600 MG per 12 hr tablet Take 1 tablet by mouth every 12 (twelve) hours as needed. For cough       . esomeprazole (NEXIUM) 40 MG capsule Take 40 mg by mouth 2 (two) times daily.        Marland Kitchen glipiZIDE (GLUCOTROL XL) 2.5 MG 24 hr tablet Take 2.5-5 mg by mouth 2 (two) times daily. 5 mg in the morning and 2.5 mg at night       . lisinopril-hydrochlorothiazide (PRINZIDE,ZESTORETIC) 20-12.5 MG per tablet Take 1 tablet by mouth every morning.        . loperamide (IMODIUM) 2 MG capsule Take 2 mg by mouth once.        . medroxyPROGESTERone (DEPO-PROVERA) 150 MG/ML injection Inject 150 mg into the muscle every 3 (three) months.        Marland Kitchen OVER THE COUNTER MEDICATION Place 2 drops into both eyes daily as needed. Saline drop for dry eyes       . traZODone (DESYREL) 100 MG tablet Take 100 mg by mouth at bedtime.        Marland Kitchen venlafaxine (EFFEXOR-XR) 75 MG 24 hr capsule Take 75 mg by mouth every evening.        . sulfamethoxazole-trimethoprim (BACTRIM DS) 800-160 MG per tablet Take 1 tablet by mouth 2 (two) times daily. For 12 days - started 12/31         ROS:   General:  No weight loss, Fever, chills  HEENT: No recent headaches, no nasal bleeding, no visual changes, + sore throat  Neurologic: No dizziness, blackouts, seizures. No recent symptoms of stroke or mini- stroke. No recent episodes of slurred speech, or temporary blindness.  Cardiac: No recent episodes of chest pain/pressure, no shortness of breath at rest.  No shortness of breath with exertion.  Denies history of atrial fibrillation or irregular heartbeat  Vascular: No history of rest pain in feet.  No history of claudication.  No history of non-healing ulcer, No history of DVT   Pulmonary: No home oxygen, no productive cough, no  hemoptysis,  No asthma or wheezing  Musculoskeletal:  [x ] Arthritis, [ ]  Low back pain,  [x ] Joint pain  Hematologic:No history of hypercoagulable state.  No history of easy bleeding.  No history of anemia  Gastrointestinal: No hematochezia or melena,  No gastroesophageal reflux, no trouble swallowing  Urinary: [ ]  chronic Kidney disease, [ ]  on HD - [ ]   MWF or [ ]  TTHS, [ ]  Burning with urination, [ ]  Frequent urination, [ ]  Difficulty urinating;   Skin: No rashes  Psychological: No history of anxiety,  No history of depression   Physical Examination  Filed Vitals:   09/04/11 1919 09/04/11 1942 09/04/11 1956 09/04/11 2015  BP: 96/68 114/75 119/85   Pulse: 121 123 113   Temp:  98 F (36.7 C)    TempSrc:  Oral    Resp: 24 22 20    Height:    5\' 7"  (1.702 m)  Weight:    238 lb (107.956 kg)  SpO2: 97% 97% 96%     Body mass index is 37.28 kg/(m^2).  General:  Alert and oriented, no acute distress HEENT: Normal Neck: No bruit or JVD Pulmonary: Clear to auscultation bilaterally Cardiac: Regular Rate and Rhythm without murmur, tachycardia Abdomen: Soft, mild epigastric tenderness, non-distended, no mass, obese Skin: No rash, toes dusky bilat. Temp symmetric Extremity Pulses:  2+ radial, brachial, femoral pulse bilaterally, right foot DP/PT doppler, left foot PT doppler Musculoskeletal: No deformity or edema  Neurologic: Upper and lower extremity motor 5/5 and symmetric, sensation feet intact and symmetric   DATA:  CBC    Component Value Date/Time   WBC 8.7 09/04/2011 1529   RBC 4.92 09/04/2011 1529   HGB 15.0 09/04/2011 1529   HCT 42.8 09/04/2011 1529   PLT 305 09/04/2011 1529   MCV 87.0 09/04/2011 1529   MCH 30.5 09/04/2011 1529   MCHC 35.0 09/04/2011 1529   RDW 12.7 09/04/2011 1529   LYMPHSABS 0.6* 09/04/2011 1529   MONOABS 0.1 09/04/2011 1529   EOSABS 0.0 09/04/2011 1529   BASOSABS 0.0 09/04/2011 1529    BMET    Component Value Date/Time   NA 140 09/04/2011 1529   K  3.7 09/04/2011 1529   CL 102 09/04/2011 1529   CO2 22 09/04/2011 1529   GLUCOSE 178* 09/04/2011 1529   BUN 13 09/04/2011 1529   CREATININE 1.20* 09/04/2011 1529   CALCIUM 8.9 09/04/2011 1529   GFRNONAA 51* 09/04/2011 1529   GFRAA 59* 09/04/2011 1529    Lactate 5  CTA Abd/Pelvis:  Thrombus lower aorta 50% narrowing, infarct kidney, infarct spleen, patent SMA, patent celiac suspect proximal thrombus narrowing lumen 50%.  I reviewed these images personally  ASSESSMENT: Atheroemboli of unknown etiology differential includes ruptured plaque, thrombus from dehydration, cardiac source.  Fortunately, major branch arteries currently open   PLAN:1.  Heparin drip  2.  Needs cardiac and thoracic aorta evaluated for thrombus source  3. Hopefully pts own lytic system will dissolve this.  3. Continue IV hydration  Will follow as consult  Fabienne Bruns, MD [facility] [place] Office: [phone] Pager: [phone]   Fabienne Bruns, MD [facility] [place] Office: [phone] Pager: [phone]

## 2011-09-04 NOTE — ED Notes (Signed)
[id number] Ready 

## 2011-09-04 NOTE — ED Notes (Signed)
Glucose 160, MD notified.  MD at bedside.  2nd liter NS started per verbal order.  MD doing bedside US.

## 2011-09-04 NOTE — Progress Notes (Signed)
ANTICOAGULATION CONSULT NOTE - Initial Consult  Pharmacy Consult for Heparin Indication: athero emboli with persistent aortic thrombus  Assessment: 56 yo F to begin heparin for an aortic thrombus with possible infarcts to the spleen.   Baseline labs are wnl.  Goal of Therapy:  Heparin level 0.3-0.7 units/ml   Plan:  1. Heparin 5000 units IV bolus then 1400 units/hr (14 ml/hr) 2. Heparin level in 6 hrs 3. Daily heparin level and CBC   Allergies  Allergen Reactions  . Celebrex (Celecoxib) Anaphylaxis  . Codeine Other (See Comments)    Violently ill  . Erythromycin Nausea And Vomiting  . Lactose Intolerance (Gi) Other (See Comments)    GI upset  . Penicillins Nausea Only    Patient Measurements: Height: 5\' 7"  (170.2 cm) Weight: 238 lb (107.956 kg) IBW/kg (Calculated) : 61.6  Heparin Dosing Weight: 86.3 kg  Vital Signs: Temp: 98 F (36.7 C) (01/15 1942) Temp src: Oral (01/15 1942) BP: 119/85 mmHg (01/15 1956) Pulse Rate: 113  (01/15 1956)  Labs:  Basename 09/04/11 1600 09/04/11 1529  HGB -- 15.0  HCT -- 42.8  PLT -- 305  APTT -- --  LABPROT -- --  INR -- --  HEPARINUNFRC -- --  CREATININE -- 1.20*  CKTOTAL 58 --  CKMB 3.3 --  TROPONINI <0.30 --   Estimated Creatinine Clearance: 68.6 ml/min (by C-G formula based on Cr of 1.2).  Medical History: Past Medical History  Diagnosis Date  . Diabetes mellitus   . Arthritis   . Migraines     Medications:  Scheduled:    . diphenhydrAMINE  25 mg Intravenous Once  . famotidine (PEPCID) IV  20 mg Intravenous Once  . fentaNYL  25 mcg Intravenous Once  . fentaNYL  25 mcg Intravenous Once  . fentaNYL  25 mcg Intravenous Once  . fentaNYL  25 mcg Intravenous Once  . heparin  1,400 Units/hr Intravenous To Major  . heparin  5,000 Units Intravenous Once  . methylPREDNISolone (SOLU-MEDROL) injection  125 mg Intravenous Once  . metoCLOPramide (REGLAN) injection  10 mg Intravenous Once  . ondansetron (ZOFRAN) IV  4  mg Intravenous Once  . sodium chloride  1,000 mL Intravenous Once  . sodium chloride  1,000 mL Intravenous Once  . sodium chloride  1,000 mL Intravenous Once    Loura Back Danielle 09/04/2011,8:16 PM

## 2011-09-04 NOTE — ED Notes (Signed)
Pt to restroom.  Pt states she feels like she will have diarrhea.

## 2011-09-04 NOTE — H&P (Signed)
Name: Kristy Stewart MRN: [id number] DOB: [birth year]  LOS: 0  CRITICAL CARE ADMISSION NOTE  History of Present Illness: 56 y/o female smoker with diabetes presents with on day of abdominal pain and nausea and vomiting.  She states that for the last month she has had URI symptoms with sinus congestion and cough.  Two weeks prior to admission she was treated with bactrim for bronchitis.  In the last few weeks she has also noted her heart racing with anxiety, and discussed it with a physician when she was seen for the bronchitis but was not told that she had A-fib.  Today she developed the sudden onset of nausea and vomiting and cramping all over abdominal pain mid morning.  She was brought here and was found to have splenic and renal infarcts on CT angio.  Lines / Drains:   Cultures / Sepsis markers: 09/04/11 blood culture  >> 09/04/11 urine culture >>  Antibiotics:   Tests / Events: 09/04/11 CXR: normal 09/04/11 CT angio: narrowing of the celiac artery, infarct in the lower pole of the right kidney and multiple wedge shaped likely infarcts in the spleen     Past Medical History  Diagnosis Date  . Diabetes mellitus   . Arthritis   . Migraines   . Allergy   . GERD (gastroesophageal reflux disease)    History reviewed. No pertinent past surgical history. Prior to Admission medications   Medication Sig Start Date End Date Taking? Authorizing Provider  albuterol (PROVENTIL HFA;VENTOLIN HFA) 108 (90 BASE) MCG/ACT inhaler Inhale 2 puffs into the lungs every 4 (four) hours as needed for wheezing (cough). 08/28/11 08/27/12 Yes Dione Booze, MD  ALPRAZolam Prudy Feeler) 0.5 MG tablet Take 0.25 mg by mouth 2 (two) times daily as needed. For anxiety    Yes Historical Provider, MD  buPROPion (WELLBUTRIN) 75 MG tablet Take 75 mg by mouth 2 (two) times daily.     Yes Historical Provider, MD  celecoxib (CELEBREX) 200 MG capsule Take 200 mg by mouth 2 (two) times daily.   Yes Historical Provider, MD    cholestyramine Lanetta Inch) 4 GM/DOSE powder Take 4 g by mouth every morning.     Yes Historical Provider, MD  dextromethorphan-guaiFENesin (MUCINEX DM) 30-600 MG per 12 hr tablet Take 1 tablet by mouth every 12 (twelve) hours as needed. For cough    Yes Historical Provider, MD  esomeprazole (NEXIUM) 40 MG capsule Take 40 mg by mouth 2 (two) times daily.     Yes Historical Provider, MD  glipiZIDE (GLUCOTROL XL) 2.5 MG 24 hr tablet Take 2.5-5 mg by mouth 2 (two) times daily. 5 mg in the morning and 2.5 mg at night    Yes Historical Provider, MD  lisinopril-hydrochlorothiazide (PRINZIDE,ZESTORETIC) 20-12.5 MG per tablet Take 1 tablet by mouth every morning.     Yes Historical Provider, MD  loperamide (IMODIUM) 2 MG capsule Take 2 mg by mouth once.     Yes Historical Provider, MD  medroxyPROGESTERone (DEPO-PROVERA) 150 MG/ML injection Inject 150 mg into the muscle every 3 (three) months.     Yes Historical Provider, MD  OVER THE COUNTER MEDICATION Place 2 drops into both eyes daily as needed. Saline drop for dry eyes    Yes Historical Provider, MD  traZODone (DESYREL) 100 MG tablet Take 100 mg by mouth at bedtime.     Yes Historical Provider, MD  venlafaxine (EFFEXOR-XR) 75 MG 24 hr capsule Take 75 mg by mouth every evening.     Yes Historical  Provider, MD  sulfamethoxazole-trimethoprim (BACTRIM DS) 800-160 MG per tablet Take 1 tablet by mouth 2 (two) times daily. For 12 days - started 12/31     Historical Provider, MD   Allergies  Allergen Reactions  . Celebrex (Celecoxib) Anaphylaxis  . Codeine Other (See Comments)    Violently ill  . Erythromycin Nausea And Vomiting  . Lactose Intolerance (Gi) Other (See Comments)    GI upset  . Penicillins Nausea Only   History reviewed. No pertinent family history. Social History  reports that she has never smoked. She has never used smokeless tobacco. She reports that she does not drink alcohol or use illicit drugs.  Review Of Systems   Gen: Denies  fever, chills, weight change, fatigue, night sweats HEENT: Denies blurred vision, double vision, hearing loss, tinnitus, she does note recent sinus congestion, rhinorrhea, sore throat, denies neck stiffness, dysphagia PULM: Denies shortness of breath, notes recent cough, sputum production, but denies hemoptysis, wheezing CV: Denies chest pain, edema, orthopnea, paroxysmal nocturnal dyspnea, palpitations GI: per hpi GU: Denies dysuria, hematuria, polyuria, oliguria, urethral discharge Endocrine: Denies hot or cold intolerance, polyuria, polyphagia or appetite change Derm: Denies rash, dry skin, scaling or peeling skin change Heme: Denies easy bruising, bleeding, bleeding gums Neuro: Denies headache, numbness, weakness, slurred speech, loss of memory or consciousness  Vital Signs:   Filed Vitals:   09/04/11 1942 09/04/11 1956 09/04/11 2015 09/04/11 2100  BP: 114/75 119/85  113/72  Pulse: 123 113  105  Temp: 98 F (36.7 C)     TempSrc: Oral     Resp: 22 20  17   Height:   5\' 7"  (1.702 m)   Weight:   107.956 kg (238 lb)   SpO2: 97% 96%  97%    Physical Examination: Gen: chronically ill appearing, mild to moderate abdominal pain HEENT: NCAT, PERRL, EOMi, OP clear, MM dry Neck: supple without masses PULM: CTA B CV: tachy, slight systolic murmur at apex, no JVD AB: BS+, soft, no guarding or rebound, no hsm Ext: warm, no edema, no clubbing, no cyanosis Derm: no rash or skin breakdown Neuro: A&Ox4, CN II-XII intact, strength 5/5 in all 4 extremities Psyche: anxious  Labs and Imaging:   CBC    Component Value Date/Time   WBC 8.7 09/04/2011 1529   RBC 4.92 09/04/2011 1529   HGB 15.0 09/04/2011 1529   HCT 42.8 09/04/2011 1529   PLT 305 09/04/2011 1529   MCV 87.0 09/04/2011 1529   MCH 30.5 09/04/2011 1529   MCHC 35.0 09/04/2011 1529   RDW 12.7 09/04/2011 1529   LYMPHSABS 0.6* 09/04/2011 1529   MONOABS 0.1 09/04/2011 1529   EOSABS 0.0 09/04/2011 1529   BASOSABS 0.0 09/04/2011 1529     BMET    Component Value Date/Time   NA 140 09/04/2011 1529   K 3.7 09/04/2011 1529   CL 102 09/04/2011 1529   CO2 22 09/04/2011 1529   GLUCOSE 178* 09/04/2011 1529   BUN 13 09/04/2011 1529   CREATININE 1.20* 09/04/2011 1529   CALCIUM 8.9 09/04/2011 1529   GFRNONAA 51* 09/04/2011 1529   GFRAA 59* 09/04/2011 1529    Lactate 5.6  EKG: sinus tach, no ST wave changes  Assessment and Plan:  This is a 56 y/o female with known diabetes who smokes and uses depo-provera for birth control who presents with the sudden onset of nausea, vomiting, and abdominal pain today.  Based on the CT findings, the pain is likely due to a renal infarct and  splenic infarct, but could also be due to mesenteric ischaemia given the elevated lactate and the celiac clot.  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 Paroxyxsmal A-fib recently with atrial clot? Aortic clot or sclerosis?   Plan:  -TEE in AM to evaluate for atrial clot and aortic clot -tele monitoring  -EKG in AM  Diabetes mellitus (09/04/2011)   Assessment:    Plan:  -SSI  Tobacco abuse (09/04/2011)   Assessment:    Plan:  -encouraged to quit -offered nicotine patch, refused  Anxiety and depression (09/04/2011)   Assessment:     Plan:  -continue home meds    Best practices / Disposition: SDU on PCCM service  Feeding/protein malnutrition: npo Analgesia: prn morphine Sedation: xanax Thromboprophylaxis: hep gtt per pharmacy HOB >30 degrees Ulcer prophylaxis: ppi Glucose control/hyperglycemia: ssi   Heber Saraland, M.D. Pulmonary and Critical Care Medicine [facility] Pager: [phone]  09/04/2011, 8:57 PM

## 2011-09-04 NOTE — ED Notes (Signed)
Patient transported from CT 

## 2011-09-04 NOTE — ED Notes (Signed)
Pt. Took a Celebrex at work, first time , became very itchy  And anxious.  She received Benadryl 50 mg, Iv  Zantac 50 mg,  She became nauseated and received Iv Zofran 4mg ,  She works at [employer] [place],  The IV and medication was given there.  After receiving the Zofran she reported severe ABdominal  Pain, and started vomiting, The surgical center felt they could not send her home and they decided to transport her to [facility] Pt. Is pale, alert and oriented X3, continues to have epigastric pain,

## 2011-09-05 ENCOUNTER — Encounter (HOSPITAL_COMMUNITY): Payer: Self-pay | Admitting: Emergency Medicine

## 2011-09-05 DIAGNOSIS — D7389 Other diseases of spleen: Secondary | ICD-10-CM

## 2011-09-05 DIAGNOSIS — I369 Nonrheumatic tricuspid valve disorder, unspecified: Secondary | ICD-10-CM

## 2011-09-05 DIAGNOSIS — N2889 Other specified disorders of kidney and ureter: Secondary | ICD-10-CM

## 2011-09-05 DIAGNOSIS — R109 Unspecified abdominal pain: Secondary | ICD-10-CM

## 2011-09-05 LAB — BASIC METABOLIC PANEL
BUN: 16 mg/dL (ref 6–23)
CO2: 16 mEq/L — ABNORMAL LOW (ref 19–32)
Calcium: 7.8 mg/dL — ABNORMAL LOW (ref 8.4–10.5)
Chloride: 102 mEq/L (ref 96–112)
Creatinine, Ser: 1.19 mg/dL — ABNORMAL HIGH (ref 0.50–1.10)

## 2011-09-05 LAB — HEPARIN LEVEL (UNFRACTIONATED)
Heparin Unfractionated: 0.19 IU/mL — ABNORMAL LOW (ref 0.30–0.70)
Heparin Unfractionated: 0.33 IU/mL (ref 0.30–0.70)
Heparin Unfractionated: 0.17 IU/mL — ABNORMAL LOW (ref 0.30–0.70)

## 2011-09-05 LAB — CBC
HCT: 37.1 % (ref 36.0–46.0)
MCH: 30.2 pg (ref 26.0–34.0)
MCV: 85.5 fL (ref 78.0–100.0)
Platelets: 220 10*3/uL (ref 150–400)
RBC: 4.34 MIL/uL (ref 3.87–5.11)
RDW: 12.7 % (ref 11.5–15.5)
WBC: 26.1 10*3/uL — ABNORMAL HIGH (ref 4.0–10.5)

## 2011-09-05 LAB — GLUCOSE, CAPILLARY: Glucose-Capillary: 318 mg/dL — ABNORMAL HIGH (ref 70–99)

## 2011-09-05 LAB — HEMOGLOBIN A1C: Mean Plasma Glucose: 280 mg/dL — ABNORMAL HIGH (ref <117)

## 2011-09-05 LAB — MRSA PCR SCREENING: MRSA by PCR: NEGATIVE

## 2011-09-05 LAB — PROTIME-INR: INR: 1.13 (ref 0.00–1.49)

## 2011-09-05 LAB — LACTIC ACID, PLASMA: Lactic Acid, Venous: 3.8 mmol/L — ABNORMAL HIGH (ref 0.5–2.2)

## 2011-09-05 MED ORDER — WHITE PETROLATUM GEL
Status: AC
  Administered 2011-09-05: 1
  Filled 2011-09-05: qty 5

## 2011-09-05 MED ORDER — WARFARIN SODIUM 10 MG PO TABS
10.0000 mg | ORAL_TABLET | Freq: Once | ORAL | Status: AC
  Administered 2011-09-05: 10 mg via ORAL
  Filled 2011-09-05 (×2): qty 1

## 2011-09-05 MED ORDER — ALPRAZOLAM 0.25 MG PO TABS
0.2500 mg | ORAL_TABLET | Freq: Two times a day (BID) | ORAL | Status: DC | PRN
  Administered 2011-09-05 – 2011-09-09 (×6): 0.25 mg via ORAL
  Filled 2011-09-05 (×7): qty 1

## 2011-09-05 MED ORDER — BUPROPION HCL 75 MG PO TABS
75.0000 mg | ORAL_TABLET | Freq: Two times a day (BID) | ORAL | Status: DC
  Administered 2011-09-05 – 2011-09-10 (×12): 75 mg via ORAL
  Filled 2011-09-05 (×14): qty 1

## 2011-09-05 MED ORDER — TRAZODONE HCL 100 MG PO TABS
100.0000 mg | ORAL_TABLET | Freq: Every day | ORAL | Status: DC
  Administered 2011-09-05 – 2011-09-09 (×5): 100 mg via ORAL
  Filled 2011-09-05 (×6): qty 1

## 2011-09-05 MED ORDER — SODIUM CHLORIDE 0.9 % IV SOLN: INTRAVENOUS | Status: DC

## 2011-09-05 MED ORDER — NICOTINE 21 MG/24HR TD PT24
21.0000 mg | MEDICATED_PATCH | Freq: Every day | TRANSDERMAL | Status: DC
  Administered 2011-09-05 – 2011-09-09 (×5): 21 mg via TRANSDERMAL
  Filled 2011-09-05 (×7): qty 1

## 2011-09-05 MED ORDER — ASPIRIN 300 MG RE SUPP
300.0000 mg | RECTAL | Status: AC
  Filled 2011-09-05: qty 1

## 2011-09-05 MED ORDER — INSULIN ASPART 100 UNIT/ML ~~LOC~~ SOLN
0.0000 [IU] | Freq: Every day | SUBCUTANEOUS | Status: DC
  Administered 2011-09-06 – 2011-09-07 (×2): 3 [IU] via SUBCUTANEOUS

## 2011-09-05 MED ORDER — ONDANSETRON HCL 4 MG/2ML IJ SOLN: 4.0000 mg | Freq: Four times a day (QID) | INTRAMUSCULAR | Status: DC | PRN

## 2011-09-05 MED ORDER — GLIPIZIDE ER 5 MG PO TB24
5.0000 mg | ORAL_TABLET | Freq: Every day | ORAL | Status: DC
  Administered 2011-09-05 – 2011-09-07 (×2): 5 mg via ORAL
  Filled 2011-09-05 (×5): qty 1

## 2011-09-05 MED ORDER — INSULIN ASPART 100 UNIT/ML ~~LOC~~ SOLN: 6.0000 [IU] | Freq: Once | SUBCUTANEOUS | Status: DC

## 2011-09-05 MED ORDER — INSULIN ASPART 100 UNIT/ML ~~LOC~~ SOLN
0.0000 [IU] | Freq: Three times a day (TID) | SUBCUTANEOUS | Status: DC
  Administered 2011-09-05: 6 [IU] via SUBCUTANEOUS
  Administered 2011-09-05: 3 [IU] via SUBCUTANEOUS
  Administered 2011-09-06 (×3): 5 [IU] via SUBCUTANEOUS
  Administered 2011-09-07 (×2): 8 [IU] via SUBCUTANEOUS
  Administered 2011-09-07: 5 [IU] via SUBCUTANEOUS
  Filled 2011-09-05: qty 3

## 2011-09-05 MED ORDER — COUMADIN BOOK
Freq: Once | Status: AC
  Administered 2011-09-05: 19:00:00
  Filled 2011-09-05: qty 1

## 2011-09-05 MED ORDER — MORPHINE SULFATE 2 MG/ML IJ SOLN
2.0000 mg | INTRAMUSCULAR | Status: DC | PRN
  Administered 2011-09-05 – 2011-09-06 (×4): 2 mg via INTRAVENOUS
  Filled 2011-09-05 (×4): qty 1

## 2011-09-05 MED ORDER — VENLAFAXINE HCL ER 75 MG PO CP24
75.0000 mg | ORAL_CAPSULE | Freq: Every evening | ORAL | Status: DC
  Administered 2011-09-05 – 2011-09-09 (×5): 75 mg via ORAL
  Filled 2011-09-05 (×6): qty 1

## 2011-09-05 MED ORDER — HEPARIN BOLUS VIA INFUSION
3000.0000 [IU] | Freq: Once | INTRAVENOUS | Status: DC
  Filled 2011-09-05: qty 3000

## 2011-09-05 MED ORDER — HEPARIN SOD (PORCINE) IN D5W 100 UNIT/ML IV SOLN
2800.0000 [IU]/h | INTRAVENOUS | Status: DC
  Administered 2011-09-05: 1700 [IU]/h via INTRAVENOUS
  Administered 2011-09-05: 2000 [IU]/h via INTRAVENOUS
  Administered 2011-09-05: 3000 [IU]/h via INTRAVENOUS
  Administered 2011-09-05: 2000 [IU]/h via INTRAVENOUS
  Administered 2011-09-06: 2550 [IU]/h via INTRAVENOUS
  Administered 2011-09-06 (×2): 2300 [IU]/h via INTRAVENOUS
  Administered 2011-09-07: 28 mL/h via INTRAVENOUS
  Administered 2011-09-07 – 2011-09-09 (×6): 2800 [IU]/h via INTRAVENOUS
  Filled 2011-09-05 (×17): qty 250

## 2011-09-05 MED ORDER — HEPARIN BOLUS VIA INFUSION
3000.0000 [IU] | Freq: Once | INTRAVENOUS | Status: AC
  Administered 2011-09-05: 3000 [IU] via INTRAVENOUS
  Filled 2011-09-05: qty 3000

## 2011-09-05 MED ORDER — WARFARIN VIDEO
Freq: Once | Status: AC
  Administered 2011-09-08: 11:00:00

## 2011-09-05 MED ORDER — ASPIRIN 81 MG PO CHEW
324.0000 mg | CHEWABLE_TABLET | ORAL | Status: AC
  Administered 2011-09-05: 81 mg via ORAL
  Filled 2011-09-05: qty 1

## 2011-09-05 MED ORDER — INSULIN ASPART 100 UNIT/ML ~~LOC~~ SOLN
4.0000 [IU] | Freq: Three times a day (TID) | SUBCUTANEOUS | Status: DC
  Administered 2011-09-05 – 2011-09-07 (×7): 4 [IU] via SUBCUTANEOUS

## 2011-09-05 NOTE — Progress Notes (Signed)
   CARE MANAGEMENT NOTE 09/05/2011  Patient:  Kristy Stewart, Kristy Stewart   Account Number:  [id number]  Date Initiated:  09/05/2011  Documentation initiated by:  [facility]  Subjective/Objective Assessment:   Admittedwith thrombus in the lower thoracic and upper abdominal aorta with evidence of infarcts to the spleen and lower pole of R kidney     Action/Plan:   PTA, PT INDEPENDENT, LIVES ALONE.   Anticipated DC Date:  09/10/2011   Anticipated DC Plan:  HOME/SELF CARE  In-house referral  Clinical Social Worker      DC Planning Services  CM consult      Choice offered to / List presented to:             Status of service:  In process, will continue to follow Medicare Important Message given?   (If response is "NO", the following Medicare IM given date fields will be blank) Date Medicare IM given:   Date Additional Medicare IM given:    Discharge Disposition:    Per UR Regulation:  Reviewed for med. necessity/level of care/duration of stay  Comments:  09/05/11 Davian Wollenberg,RN,BSN 1500 MET WITH PT AND CSW, AT REQUEST OF MD AND BEDSIDE RN.  PT VERY ANXIOUS; HAS FINANCIAL CONCERNS REGARDING MEDICATIONS AND HOSPITAL BILL.  CSW PROVIDED EMOTIONAL SUPPORT AND METHODS FOR RELIEVING ANXIETY.  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  REASSURED PT AND ASSISTED HER IN VERBALIZING CONCERNS. PT STATES SHE HAS RECENTLY BEEN PUT ON INSULIN, AND HER COPAYS FOR STRIPS, NEEDLES AND INSULIN ARE VERY EXPENSIVE.  CASE MANAGEMENT WILL INVESTIGATE ASSISTANCE PROGRAMS OR  POSSIBILITY OF CHANGING MEDS TO LESS EXPENSIVE ALTERNATIVES.  WILL FOLLOW UP. Phone #[phone]  09-05-11 1:40pm Avie Arenas, RNBSN - [phone] UR Completed.

## 2011-09-05 NOTE — Progress Notes (Signed)
[facility] [place]  Subjective  - Abdominal pain resolved asking to go home  Objective 114/68 99 98.1 F (36.7 C) (Oral) 19 98%  Intake/Output Summary (Last 24 hours) at 09/05/11 1436 Last data filed at 09/05/11 0800  Gross per 24 hour  Intake    681 ml  Output      0 ml  Net    681 ml   Abdomen soft non tender Feet toes dusky bilat  Assessment/Planning: Work up in progress to r/o cardiac source If ECHO is negative needs CT angio of chest Will get ABI today to eval lower extremity perfusion Continue heparin Ok to start coumadin per pharmacy Would prefer to not use lovenox bridge and have INR of 2 prior to discharge as she may have some compliance issues  Leukocytosis probably inflammatory from splenic infarct cont to follow Hyperglycemia per medical team Creatinine stable despite renal infarct Some acidosis but clinically looks good cont to follow  Following  Deanette Tullius E 09/05/2011 2:36 PM --  Laboratory Lab Results:  Basename 09/05/11 0300 09/04/11 1529  WBC 26.1* 8.7  HGB 13.1 15.0  HCT 37.1 42.8  PLT 220 305   BMET  Basename 09/05/11 0300 09/04/11 1529  NA 132* 140  K 4.7 3.7  CL 102 102  CO2 16* 22  GLUCOSE 309* 178*  BUN 16 13  CREATININE 1.19* 1.20*  CALCIUM 7.8* 8.9    COAG No results found for this basename: INR, PROTIME   No results found for this basename: PTT    Antibiotics Anti-infectives    None

## 2011-09-05 NOTE — Progress Notes (Signed)
ANTICOAGULATION CONSULT NOTE - Follow Up Consult  Pharmacy Consult for heparin Indication: aortic thrombus with renal/splenic emboli  Allergies  Allergen Reactions  . Celebrex (Celecoxib) Anaphylaxis  . Codeine Other (See Comments)    Violently ill  . Erythromycin Nausea And Vomiting  . Lactose Intolerance (Gi) Other (See Comments)    GI upset  . Penicillins Nausea Only    Patient Measurements: Height: 5\' 7"  (170.2 cm) Weight: 246 lb 0.5 oz (111.6 kg) IBW/kg (Calculated) : 61.6  Heparin Dosing Weight: 86.3 kg  Vital Signs: Temp: 98.1 F (36.7 C) (01/16 0313) Temp src: Oral (01/16 0313) BP: 114/68 mmHg (01/16 0313) Pulse Rate: 99  (01/16 0313)  Labs:  Basename 09/05/11 0940 09/05/11 0300 09/04/11 1600 09/04/11 1529  HGB -- 13.1 -- 15.0  HCT -- 37.1 -- 42.8  PLT -- 220 -- 305  APTT -- -- -- --  LABPROT -- -- -- --  INR -- -- -- --  HEPARINUNFRC 0.33 0.19* -- --  CREATININE -- 1.19* -- 1.20*  CKTOTAL -- -- 58 --  CKMB -- -- 3.3 --  TROPONINI -- -- <0.30 --   Estimated Creatinine Clearance: 70.4 ml/min (by C-G formula based on Cr of 1.19).   Medications:  Scheduled:    . aspirin  324 mg Oral NOW   Or  . aspirin  300 mg Rectal NOW  . buPROPion  75 mg Oral BID  . diphenhydrAMINE  25 mg Intravenous Once  . famotidine (PEPCID) IV  20 mg Intravenous Once  . fentaNYL  25 mcg Intravenous Once  . fentaNYL  25 mcg Intravenous Once  . fentaNYL  25 mcg Intravenous Once  . fentaNYL  25 mcg Intravenous Once  . gi cocktail  30 mL Oral Once  . heparin  1,400 Units/hr Intravenous To Major  . heparin  3,000 Units Intravenous Once  . heparin  5,000 Units Intravenous Once  . insulin aspart  6 Units Subcutaneous Once  . methylPREDNISolone (SOLU-MEDROL) injection  125 mg Intravenous Once  . metoCLOPramide (REGLAN) injection  10 mg Intravenous Once  .  morphine injection  4 mg Intravenous Once  . ondansetron (ZOFRAN) IV  4 mg Intravenous Once  . sodium chloride  1,000 mL  Intravenous Once  . sodium chloride  1,000 mL Intravenous Once  . sodium chloride  1,000 mL Intravenous Once  . traZODone  100 mg Oral QHS  . venlafaxine  75 mg Oral QPM    Assessment: 56 yo F presents with N/V abd pain, found to have aortic thrombus with renal/splenic infarcts to begin heparin. First Heparin level was subtherapeutic and dose adjusted.  Heparin level recheck = 0.33 (low end goal). No bleeding noted and CBC stable.  Goal of Therapy:  Heparin level 0.3-0.7 units/ml   Plan:  1. As level drawn 4hrs after rate change documented, continue current rate and recheck level at 15:00.    Kristy Stewart 09/05/2011,10:42 AM

## 2011-09-05 NOTE — Progress Notes (Signed)
09/05/11  Spoke with patient about her diabetes.  Was diagnosed with diabetes Type 2 about 3 year ago..  Was initially on Metformin, was discontinued recently and started on Glipizide 10 mg twice a day. Was started on insulin about a week ago.  She thinks it is Lantus 10 units twice a day with a pen.  States that her blood glucose meter has not worked for a while; has ordered a new one. States the strips and medications are expensive.  Works full time at the [employer] for [employer]. Very concerned about missing work.  Has attended outpatient Diabetes classes in the past.   Suggest that she watch the DM videos #506 and #508 on insulin while in the hospital.  Staff nurses to watch patient give injection to check her procedure.  Will continue to follow while in hospital.  Smith Mince RN BSN

## 2011-09-05 NOTE — Progress Notes (Signed)
UR Completed.  Kristy Stewart Jane [phone] 06/25/2012  

## 2011-09-05 NOTE — Progress Notes (Signed)
Inpatient Diabetes Program Recommendations  AACE/ADA: New Consensus Statement on Inpatient Glycemic Control (2009)  Target Ranges:  Prepandial:   less than 140 mg/dL      Peak postprandial:   less than 180 mg/dL (1-2 hours)      Critically ill patients:  140 - 180 mg/dL   Reason for Visit: CBGS greater than 180 mg/dl  Inpatient Diabetes Program Recommendations Correction (SSI): Start Novolog MODERATE correction scale every 4 hours while NPO;  then AC & HS when eating if CBGs continue greater than 180 mg/dl  Note:

## 2011-09-05 NOTE — Progress Notes (Signed)
  Echocardiogram 2D Echocardiogram has been performed.  Kristy Stewart 09/05/2011, 4:05 PM

## 2011-09-05 NOTE — Progress Notes (Signed)
ANTICOAGULATION CONSULT NOTE - Follow Up Consult  Pharmacy Consult for Heparin and Coumadin Indication: aortic thrombus with renal/splenic emboli  Allergies  Allergen Reactions  . Celebrex (Celecoxib) Anaphylaxis  . Codeine Other (See Comments)    Violently ill  . Erythromycin Nausea And Vomiting  . Lactose Intolerance (Gi) Other (See Comments)    GI upset  . Penicillins Nausea Only    Patient Measurements: Height: 5\' 7"  (170.2 cm) Weight: 246 lb 0.5 oz (111.6 kg) IBW/kg (Calculated) : 61.6  Heparin Dosing Weight: 86.3 kg  Vital Signs: Temp src: Oral (01/16 1200)  Labs:  Basename 09/05/11 1617 09/05/11 0940 09/05/11 0300 09/04/11 1600 09/04/11 1529  HGB -- -- 13.1 -- 15.0  HCT -- -- 37.1 -- 42.8  PLT -- -- 220 -- 305  APTT -- -- -- -- --  LABPROT 14.7 -- -- -- --  INR 1.13 -- -- -- --  HEPARINUNFRC 0.17* 0.33 0.19* -- --  CREATININE -- -- 1.19* -- 1.20*  CKTOTAL -- -- -- 58 --  CKMB -- -- -- 3.3 --  TROPONINI -- -- -- <0.30 --   Estimated Creatinine Clearance: 70.4 ml/min (by C-G formula based on Cr of 1.19).   Medications:  Scheduled:     . aspirin  324 mg Oral NOW   Or  . aspirin  300 mg Rectal NOW  . buPROPion  75 mg Oral BID  . fentaNYL  25 mcg Intravenous Once  . fentaNYL  25 mcg Intravenous Once  . fentaNYL  25 mcg Intravenous Once  . gi cocktail  30 mL Oral Once  . glipiZIDE  5 mg Oral Q breakfast  . heparin  1,400 Units/hr Intravenous To Major  . heparin  3,000 Units Intravenous Once  . heparin  5,000 Units Intravenous Once  . insulin aspart  0-15 Units Subcutaneous TID WC  . insulin aspart  0-5 Units Subcutaneous QHS  . insulin aspart  4 Units Subcutaneous TID WC  . insulin aspart  6 Units Subcutaneous Once  .  morphine injection  4 mg Intravenous Once  . sodium chloride  1,000 mL Intravenous Once  . sodium chloride  1,000 mL Intravenous Once  . traZODone  100 mg Oral QHS  . venlafaxine  75 mg Oral QPM  . white petrolatum         Assessment: 56 yo F found to have aortic thrombus with renal/splenic infarcts on heparin, now to begin Coumadin. Heparin level is subtherapeutic and INR is normal at baseline. No bleeding noted and CBC stable.  Spoke with RN and patient and heparin has been infusing appropriately. Pump is correctly programmed.  Coumadin score = 7  Goal of Therapy:  Heparin level 0.3-0.7 units/ml INR 2-3   Plan:  1. Heparin 3000 units IV bolus then increase to 2000 units/hr (20 ml/hr) 2. Heparin level 6 hrs after rate change 3. Coumadin 10 mg po tonight 4. INR daily 5. Coumadin book and video  Lovell Sheehan 09/05/2011,5:34 PM

## 2011-09-05 NOTE — Progress Notes (Signed)
Clinical Child psychotherapist met with pt and RNCM and completed psychosocial assessment, please see shadow chart for details.  Of note, pt responded well to Mindfulness techniques such as deep breathing and visualization.  CSW to continue to follow and assist as needed.   Angelia Mould, MSW, [place] [phone]

## 2011-09-05 NOTE — H&P (Signed)
Name: Kristy Stewart MRN: [id number] DOB: 01-Aug-1956  LOS: 1  CRITICAL CARE ADMISSION NOTE  History of Present Illness: 56 y/o female smoker with diabetes presented to ED 1/15 after taking first dose of celebrex (prescribed for carpal tunnel syndrome) and suffering itching and rash. Received usual medical therapy for possible allergic reaction. While in ED, developed nausea, vomiting and abd pain. CTA abdomen was performed and revealed a plaque or thrombus in the lower thoracic and upper abdominal aorta with evidence of infarcts to the spleen and lower pole of R kidney. She was seen in consultation by [facility] who have recommended anticoagulation and no indication for thrombectomy @ present. There was some question of a past or recent history of PAF but she denies this presently.   SUBJ: No complaints. Desperately wants to go home. Tearful when I explained that she must remain hospitalized for now until we complete our eval and decide on ultimate treatment course.    Cultures / Sepsis markers: 09/04/11 blood culture  >> 09/04/11 urine culture >>  Tests / Events: 09/04/11 CT angio: narrowing of the celiac artery, infarct in the lower pole of the right kidney and multiple wedge shaped likely infarcts in the spleen 1/16: TTE >>     Vital Signs:   Filed Vitals:   09/04/11 2301 09/05/11 0008 09/05/11 0313 09/05/11 0500  BP: 133/116 117/61 114/68   Pulse: 117 108 99   Temp: 96.8 F (36 C) 97.7 F (36.5 C) 98.1 F (36.7 C)   TempSrc: Oral Oral Oral   Resp: 16 22 19    Height:      Weight:    111.6 kg (246 lb 0.5 oz)  SpO2: 97% 98% 98%     Physical Examination: Gen: NAD HEENT: NCAT, PERRL, EOMi, OP clear, MM dry Neck: supple without masses PULM: CTA B CV: tachy, slight systolic murmur at apex, no JVD AB: BS+, soft, no guarding or rebound, no hsm Ext: warm, no edema, no clubbing, no cyanosis Derm: no rash or skin breakdown Neuro: A&Ox4, CN II-XII intact, strength 5/5 in all 4  extremities Psyche: anxious  Labs and Imaging:   CBC    Component Value Date/Time   WBC 26.1* 09/05/2011 0300   RBC 4.34 09/05/2011 0300   HGB 13.1 09/05/2011 0300   HCT 37.1 09/05/2011 0300   PLT 220 09/05/2011 0300   MCV 85.5 09/05/2011 0300   MCH 30.2 09/05/2011 0300   MCHC 35.3 09/05/2011 0300   RDW 12.7 09/05/2011 0300   LYMPHSABS 0.6* 09/04/2011 1529   MONOABS 0.1 09/04/2011 1529   EOSABS 0.0 09/04/2011 1529   BASOSABS 0.0 09/04/2011 1529    BMET    Component Value Date/Time   NA 132* 09/05/2011 0300   K 4.7 09/05/2011 0300   CL 102 09/05/2011 0300   CO2 16* 09/05/2011 0300   GLUCOSE 309* 09/05/2011 0300   BUN 16 09/05/2011 0300   CREATININE 1.19* 09/05/2011 0300   CALCIUM 7.8* 09/05/2011 0300   GFRNONAA 51* 09/05/2011 0300   GFRAA 59* 09/05/2011 0300    EKG: sinus tach, no ST wave changes  Assessment and Plan:  This is a 56 y/o female with known diabetes who smokes and uses depo-provera for birth control who presents with the sudden onset of nausea, vomiting, and abdominal pain today.  Based on the CT findings, the pain is likely due to a renal infarct and splenic infarct, but could also be due to mesenteric ischaemia given the elevated lactate and the celiac  clot.    Aortic thrombus, splenic infarcts, R renal infarct (09/04/2011) - etiology unclear. In absence of history of AF, cardiac source unlikely. Perhaps this was a ruptured plaque with in situ thrombosis and subsequent embolization to her abd organs.  - Dr Darrick Penna following > will discuss with him further. - Cont heparin for now - begin transition to warfarin today. Will likely need at least 6 mo course - hypercoag panel ordered. Depo- provera on hold - check Echo - If TTE appears normal, I think a TEE is very low yield - needs smoking cessation  Nausea & vomiting (09/04/2011) - resolved - Begin diet today  Diabetes mellitus (09/04/2011)   Assessment:    Plan:  -SSI ordered - resume Glipizide @ reduced dose  today   Anxiety and depression (09/04/2011)   Assessment:    Plan:  -continue home meds   Disposition: transfer to reg bed and TRH to assume care 1/17 (discussed with Dr Sharon Seller - [facility] team 2)    Billy Fischer, MD;  PCCM service; [place] [phone]   09/05/2011, 10:47 AM

## 2011-09-05 NOTE — Progress Notes (Signed)
Report called to Rn on 5100, VSS,-SBP range is soft 80-120 MD aware, all meds current to time. Pt to transfer per MD order.

## 2011-09-05 NOTE — Progress Notes (Signed)
4:10 AM  Heparin level 0.19 after 5000 unit bolus and rate increase to 1400 units/hr   No bleeding reported.   Goal for aortic thrombus 0.3-0.7   Will repeat bolus of 3000 units and increase to 1700 units/hr with 6 hour HL for F/U  Janice Coffin

## 2011-09-06 ENCOUNTER — Inpatient Hospital Stay (HOSPITAL_COMMUNITY): Payer: BC Managed Care – PPO

## 2011-09-06 DIAGNOSIS — R0989 Other specified symptoms and signs involving the circulatory and respiratory systems: Secondary | ICD-10-CM

## 2011-09-06 DIAGNOSIS — I8290 Acute embolism and thrombosis of unspecified vein: Secondary | ICD-10-CM | POA: Diagnosis present

## 2011-09-06 LAB — HEPARIN LEVEL (UNFRACTIONATED)
Heparin Unfractionated: 0.25 IU/mL — ABNORMAL LOW (ref 0.30–0.70)
Heparin Unfractionated: 0.39 IU/mL (ref 0.30–0.70)

## 2011-09-06 LAB — BASIC METABOLIC PANEL
BUN: 13 mg/dL (ref 6–23)
CO2: 20 mEq/L (ref 19–32)
Chloride: 98 mEq/L (ref 96–112)
GFR calc Af Amer: >90 mL/min (ref >90)
Potassium: 3.7 mEq/L (ref 3.5–5.1)

## 2011-09-06 LAB — CBC
HCT: 38.4 % (ref 36.0–46.0)
Hemoglobin: 13.2 g/dL (ref 12.0–15.0)
MCV: 85.9 fL (ref 78.0–100.0)
RBC: 4.47 MIL/uL (ref 3.87–5.11)
RDW: 13.1 % (ref 11.5–15.5)
WBC: 19.6 10*3/uL — ABNORMAL HIGH (ref 4.0–10.5)

## 2011-09-06 LAB — HOMOCYSTEINE: Homocysteine: 10.8 umol/L (ref 4.0–15.4)

## 2011-09-06 LAB — LACTATE DEHYDROGENASE: LDH: 530 U/L — ABNORMAL HIGH (ref 94–250)

## 2011-09-06 LAB — PROTIME-INR: INR: 1.22 (ref 0.00–1.49)

## 2011-09-06 MED ORDER — INSULIN GLARGINE 100 UNIT/ML ~~LOC~~ SOLN
15.0000 [IU] | Freq: Every day | SUBCUTANEOUS | Status: DC
  Administered 2011-09-06 – 2011-09-07 (×2): 15 [IU] via SUBCUTANEOUS
  Filled 2011-09-06: qty 3

## 2011-09-06 MED ORDER — WARFARIN SODIUM 10 MG PO TABS
10.0000 mg | ORAL_TABLET | Freq: Once | ORAL | Status: AC
  Administered 2011-09-06: 10 mg via ORAL
  Filled 2011-09-06: qty 1

## 2011-09-06 MED ORDER — HYDROCODONE-ACETAMINOPHEN 5-325 MG PO TABS
1.0000 | ORAL_TABLET | Freq: Four times a day (QID) | ORAL | Status: DC | PRN
  Administered 2011-09-06 – 2011-09-10 (×12): 2 via ORAL
  Filled 2011-09-06 (×12): qty 2

## 2011-09-06 MED ORDER — IOHEXOL 350 MG/ML SOLN: 120.0000 mL | Freq: Once | INTRAVENOUS | Status: AC | PRN

## 2011-09-06 NOTE — Progress Notes (Signed)
ANTICOAGULATION CONSULT NOTE - Follow Up Consult  Pharmacy Consult for Coumadin with Full-dose Heparin bridging Indication: aortic thrombus with renal/splenic emboli   Allergies  Allergen Reactions  . Celebrex (Celecoxib) Anaphylaxis  . Codeine Other (See Comments)    Violently ill  . Erythromycin Nausea And Vomiting  . Lactose Intolerance (Gi) Other (See Comments)    GI upset  . Penicillins Nausea Only    Patient Measurements: Height: 5\' 7"  (170.2 cm) Weight: 246 lb 0.5 oz (111.6 kg) IBW/kg (Calculated) : 61.6    Vital Signs: Temp: 98.6 F (37 C) (01/17 0953) Temp src: Oral (01/17 0635) BP: 151/98 mmHg (01/17 0953) Pulse Rate: 95  (01/17 0953)  Labs:  Basename 09/06/11 0918 09/06/11 0520 09/05/11 2357 09/05/11 1617 09/05/11 0300 09/04/11 1600 09/04/11 1529  HGB -- 13.2 -- -- 13.1 -- --  HCT -- 38.4 -- -- 37.1 -- 42.8  PLT -- 165 -- -- 220 -- 305  APTT -- -- -- -- -- -- --  LABPROT -- 15.7* -- 14.7 -- -- --  INR -- 1.22 -- 1.13 -- -- --  HEPARINUNFRC 0.39 -- 0.25* 0.17* -- -- --  CREATININE -- 0.69 -- -- 1.19* -- 1.20*  CKTOTAL -- -- -- -- -- 58 --  CKMB -- -- -- -- -- 3.3 --  TROPONINI -- -- -- -- -- <0.30 --   Estimated Creatinine Clearance: 104.8 ml/min (by C-G formula based on Cr of 0.69).   Medications:  Prescriptions prior to admission  Medication Sig Dispense Refill  . albuterol (PROVENTIL HFA;VENTOLIN HFA) 108 (90 BASE) MCG/ACT inhaler Inhale 2 puffs into the lungs every 4 (four) hours as needed for wheezing (cough).  1 Inhaler  0  . ALPRAZolam (XANAX) 0.5 MG tablet Take 0.25 mg by mouth 2 (two) times daily as needed. For anxiety       . buPROPion (WELLBUTRIN) 75 MG tablet Take 75 mg by mouth 2 (two) times daily.        . celecoxib (CELEBREX) 200 MG capsule Take 200 mg by mouth 2 (two) times daily.      . cholestyramine (QUESTRAN) 4 GM/DOSE powder Take 4 g by mouth every morning.        Marland Kitchen dextromethorphan-guaiFENesin (MUCINEX DM) 30-600 MG per 12 hr  tablet Take 1 tablet by mouth every 12 (twelve) hours as needed. For cough       . esomeprazole (NEXIUM) 40 MG capsule Take 40 mg by mouth 2 (two) times daily.        Marland Kitchen glipiZIDE (GLUCOTROL XL) 2.5 MG 24 hr tablet Take 2.5-5 mg by mouth 2 (two) times daily. 5 mg in the morning and 2.5 mg at night       . lisinopril-hydrochlorothiazide (PRINZIDE,ZESTORETIC) 20-12.5 MG per tablet Take 1 tablet by mouth every morning.        . loperamide (IMODIUM) 2 MG capsule Take 2 mg by mouth once.        . medroxyPROGESTERone (DEPO-PROVERA) 150 MG/ML injection Inject 150 mg into the muscle every 3 (three) months.        Marland Kitchen OVER THE COUNTER MEDICATION Place 2 drops into both eyes daily as needed. Saline drop for dry eyes       . traZODone (DESYREL) 100 MG tablet Take 100 mg by mouth at bedtime.        Marland Kitchen venlafaxine (EFFEXOR-XR) 75 MG 24 hr capsule Take 75 mg by mouth every evening.        . sulfamethoxazole-trimethoprim (BACTRIM  DS) 800-160 MG per tablet Take 1 tablet by mouth 2 (two) times daily. For 12 days - started 12/31        Scheduled:    . buPROPion  75 mg Oral BID  . coumadin book   Does not apply Once  . glipiZIDE  5 mg Oral Q breakfast  . heparin  3,000 Units Intravenous Once  . heparin  3,000 Units Intravenous Once  . insulin aspart  0-15 Units Subcutaneous TID WC  . insulin aspart  0-5 Units Subcutaneous QHS  . insulin aspart  4 Units Subcutaneous TID WC  . insulin aspart  6 Units Subcutaneous Once  . nicotine  21 mg Transdermal QHS  . traZODone  100 mg Oral QHS  . venlafaxine  75 mg Oral QPM  . warfarin  10 mg Oral Once  . warfarin   Does not apply Once   Infusions:    . heparin 2,300 Units/hr (09/06/11 1233)   Anti-infectives    None      Assessment: Patient is a 56 y/o female admitted with an aortic thrombus with renal/splenic emboli.  She is now on Coumadin with full-dose Heparin bridging.  Heparin level 0.39, on low end of therapeutic spectrum.  INR reported as 1.22.  Goal of  Therapy:  1.  INR 2 - 3 2.  Heparin levels  0.3 - 0.7   Plan:  1.  Coumadin 10mg  po today. 2.  Increase Heparin to 2550 units/hour. 3.  Next Heparin level in 8 hours.  Shivali Quackenbush, Elisha Headland, Pharm.D. 09/06/2011 12:54 PM

## 2011-09-06 NOTE — Progress Notes (Signed)
*  PRELIMINARY RESULTS*   ABIs completed.  ABIs >1.0 bilaterally with biphasic waveforms except monophasic in the right DP.    Sherren Kerns Renee 09/06/2011, 1:44 PM

## 2011-09-06 NOTE — Progress Notes (Addendum)
Subjective: Feels much better wants to go home. Objective: Filed Vitals:   09/05/11 2236 09/06/11 0201 09/06/11 0635 09/06/11 0953  BP: 129/80 139/73 148/73 151/98  Pulse: 99 105 95 95  Temp: 98 F (36.7 C) 99 F (37.2 C) 98.7 F (37.1 C) 98.6 F (37 C)  TempSrc: Oral Oral Oral   Resp: 20 18 18 20   Height:      Weight:      SpO2: 99% 96% 94% 96%   Weight change:   Intake/Output Summary (Last 24 hours) at 09/06/11 1440 Last data filed at 09/06/11 0900  Gross per 24 hour  Intake      0 ml  Output      0 ml  Net      0 ml    General: Alert, awake, oriented x3, in no acute distress.  HEENT: No bruits, no goiter.  Heart: Regular rate and rhythm, without murmurs, rubs, gallops.  Lungs: Good air movement CTA b/l Abdomen: Soft, nontender, nondistended, positive bowel sounds.  Neuro: Grossly intact, nonfocal.   Lab Results:  Basename 09/06/11 0520 09/05/11 0300 09/04/11 1529  NA 130* 132* 140  K 3.7 4.7 3.7  CL 98 102 102  CO2 20 16* 22  GLUCOSE 229* 309* 178*  BUN 13 16 13   CREATININE 0.69 1.19* 1.20*  CALCIUM 8.3* 7.8* 8.9  MG -- -- --  PHOS -- -- --    Basename 09/04/11 1529  AST 18  ALT 11  ALKPHOS 98  BILITOT 0.3  PROT 6.4  ALBUMIN 3.4*    Basename 09/04/11 1529  LIPASE 34  AMYLASE --    Basename 09/06/11 0520 09/05/11 0300 09/04/11 1529  WBC 19.6* 26.1* --  NEUTROABS -- -- 7.9*  HGB 13.2 13.1 --  HCT 38.4 37.1 --  MCV 85.9 85.5 --  PLT 165 220 --    Basename 09/04/11 1600  CKTOTAL 58  CKMB 3.3  CKMBINDEX --  TROPONINI <0.30    Basename 09/05/11 1231  HGBA1C 11.4*   Micro Results: Recent Results (from the past 240 hour(s))  CULTURE, BLOOD (ROUTINE X 2)     Status: Normal (Preliminary result)   Collection Time   09/04/11  4:10 PM      Component Value Range Status Comment   Specimen Description BLOOD ARM LEFT   Final    Special Requests BOTTLES DRAWN AEROBIC AND ANAEROBIC 10CC   Final    Setup Time [id number]   Final    Culture      Final    Value:        BLOOD CULTURE RECEIVED NO GROWTH TO DATE CULTURE WILL BE HELD FOR 5 DAYS BEFORE ISSUING A FINAL NEGATIVE REPORT   Report Status PENDING   Incomplete   CULTURE, BLOOD (ROUTINE X 2)     Status: Normal (Preliminary result)   Collection Time   09/04/11  4:15 PM      Component Value Range Status Comment   Specimen Description BLOOD ARM LEFT   Final    Special Requests BOTTLES DRAWN AEROBIC AND ANAEROBIC 10CC   Final    Setup Time [id number]   Final    Culture     Final    Value:        BLOOD CULTURE RECEIVED NO GROWTH TO DATE CULTURE WILL BE HELD FOR 5 DAYS BEFORE ISSUING A FINAL NEGATIVE REPORT   Report Status PENDING   Incomplete   MRSA PCR SCREENING     Status: Normal  Collection Time   09/05/11  1:50 AM      Component Value Range Status Comment   MRSA by PCR NEGATIVE  NEGATIVE  Final     Studies/Results: Dg Chest Portable 1 View  09/04/2011  *RADIOLOGY REPORT*  Clinical Data: Rule out free air  PORTABLE CHEST - 1 VIEW  Comparison: None  Findings: The heart size and mediastinal contours are within normal limits.  Both lungs are clear.  The visualized skeletal structures are unremarkable. No lucencies identified in the projection of the upper abdomen.  IMPRESSION: 1.  No acute cardiopulmonary abnormalities. 2.  Exam insufficient for ruling out "free air."  Recommend upright PA radiograph of the chest as well as dedicated abdominal series.  Original Report Authenticated By: Rosealee Albee, M.D.   Ct Angio Abd/pel W/ And/or W/o  09/04/2011  *RADIOLOGY REPORT*  Clinical Data:  Severe abdominal pain, nausea, vomiting, diarrhea  CT ANGIOGRAPHY ABDOMEN AND PELVIS  Technique:  Multidetector CT imaging of the abdomen and pelvis was performed using the standard protocol during bolus administration of intravenous contrast.  Multiplanar reconstructed images including MIPs were obtained and reviewed to evaluate the vascular anatomy.  Contrast: OMNIPAQUE IOHEXOL 300 MG/ML  IV SOLN  Comparison:  09/18/2007 abdominal ultrasound  Findings:  Lower thoracic aorta demonstrates a small amount of hypodense eccentric wall plaque formation versus nonocclusive thrombus, image #2.  Otherwise the lower thoracic aorta is patent. Abdominal aorta demonstrates mild atherosclerotic changes without aneurysm or dissection.  No acute retroperitoneal hemorrhage.  Celiac origin is minimally narrowed by a superior aspect noncalcified plaque formation.  Degree of celiac origin narrowing is less than 50%.  Celiac branches remain patent.  SMA origin is widely patent.  SMA main ileocolic trunk is visualized and patent supplying jejunal and colic branches throughout the mesentery.  Exam is limited with some respiratory motion artifact through the abdomen.  Infrarenal abdominal aorta demonstrates anterior hypodense noncalcified plaque formation or thrombus narrowing the aortic lumen approximately 50%, image 89.  This extends to the IMA origin which is not well visualized however the IMA itself is patent distally.  Aortic bifurcation is patent.  Pelvic iliac vessels demonstrate mild atherosclerosis but are patent.  Additional axial imaging:  Liver demonstrates no focal hepatic abnormality. Hepatic and portal veins are patent.  The gallbladder, biliary system, pancreas, adrenal glands, and left kidney within normal limits and demonstrate no acute finding.  Spleen demonstrates a patchy enhancement pattern even on the portal venous phase imaging.  This appears somewhat wedge-shaped and peripheral. Areas of multi focal splenic infarction could have this appearance possibly from an embolic process.  The tortuous main splenic artery appears patent.  Right kidney demonstrates a lower pole wedge shaped defect extending to the cortical margin, image 36 series 7 also suspicious for a renal infarct, possibly embolic.  Renal arteries both remain patent.  Negative for bowel obstruction, significant dilatation, ileus or free  air. No visualized bowel wall thickening.  colon is relatively decompressed.  Cecum is low lying in the pelvis.  Normal appendix identified containing air.  No abdominal or pelvic free fluid, fluid collection, hemorrhage, hematoma, adenopathy, inguinal abnormality, or hernia.  Mild diffuse degenerative changes of the spine.  No compression fracture.   Review of the MIP images confirms the above findings.  IMPRESSION: Heterogeneous enhancing pattern of the spleen which persist on the portal venous phase delayed imaging suspicious for multi focal splenic infarcts.  Wedge shaped defect right kidney lower pole suspicious for a renal  infarct.  Finding suggest a possible embolic phenomenon.  Small nonocclusive noncalcified plaque formation or thrombus in the lower thoracic aorta and to a more pronounced degree in the infrarenal abdominal aorta.  Slight narrowing of the celiac origin however the celiac, SMA and IMA appear patent.  Single renal arteries appear patent as well.  No CT evidence of bowel obstruction, bowel wall thickening, pneumatosis, or portal venous gas  Original Report Authenticated By: Judie Petit. Ruel Favors, M.D.    Medications: I have reviewed the patient's current medications.  Assessment and plan: Principal Problem: 1.Aortic Thrombus:  continue heparin and coumadin. Vascular following. Agree with Ct angio of chest. ABI pending. WBC trending down probably 2/2 to infarcts. Hypercoagulable panel pending.  2. Renal infarct: Conservative management.  3.Splenic infarct Conservative management.   4.Mesenteric ischemia No further abdominal pain. 5. Diabetes mellitus  add lantus avoid metformin for now.  6. Nausea and vomitting Resolved. ? Mesenteric ischecmia. Check LDH.   LOS: 2 days   Marinda Elk M.D. Pager: [phone] [facility] 09/06/2011, 2:40 PM

## 2011-09-06 NOTE — Progress Notes (Signed)
ANTICOAGULATION CONSULT NOTE - Follow Up Consult  Pharmacy Consult for Coumadin with Full-dose Heparin bridging Indication: aortic thrombus with renal/splenic emboli   Allergies  Allergen Reactions  . Celebrex (Celecoxib) Anaphylaxis  . Codeine Other (See Comments)    Violently ill; reports having taken Vicodin previously without problems  . Erythromycin Nausea And Vomiting  . Lactose Intolerance (Gi) Other (See Comments)    GI upset  . Penicillins Nausea Only    Patient Measurements: Height: 5\' 7"  (170.2 cm) Weight: 246 lb 0.5 oz (111.6 kg) IBW/kg (Calculated) : 61.6  Heparin Dosing Weight: 87.4 kg   Vital Signs: Temp: 98.4 F (36.9 C) (01/17 2137) Temp src: Oral (01/17 1700) BP: 112/52 mmHg (01/17 2137) Pulse Rate: 109  (01/17 2137)  Labs:  Kristy Stewart 09/06/11 2037 09/06/11 0918 09/06/11 0520 09/05/11 2357 09/05/11 1617 09/05/11 0300 09/04/11 1600 09/04/11 1529  HGB -- -- 13.2 -- -- 13.1 -- --  HCT -- -- 38.4 -- -- 37.1 -- 42.8  PLT -- -- 165 -- -- 220 -- 305  APTT -- -- -- -- -- -- -- --  LABPROT -- -- 15.7* -- 14.7 -- -- --  INR -- -- 1.22 -- 1.13 -- -- --  HEPARINUNFRC 0.31 0.39 -- 0.25* -- -- -- --  CREATININE -- -- 0.69 -- -- 1.19* -- 1.20*  CKTOTAL -- -- -- -- -- -- 58 --  CKMB -- -- -- -- -- -- 3.3 --  TROPONINI -- -- -- -- -- -- <0.30 --   Estimated Creatinine Clearance: 104.8 ml/min (by C-G formula based on Cr of 0.69).   Assessment: 56 yo F on heparin --> Coumadin for aortic thrombus with renal/splenic emboli.  PM heparin level is therapeutic but trending down.  With clot burden, would like for patient to be in the mid-to-high end of therapeutic goal.  Goal of Therapy:  Heparin levels  0.3 - 0.7   Plan:  Will increase heparin to 2700 units/hr. Follow-up with AM labs.  [employer], Pharm.D., BCPS Clinical Pharmacist Pager [phone]  09/06/2011 10:24 PM

## 2011-09-06 NOTE — Progress Notes (Addendum)
    Inpatient Diabetes Program Recommendations  AACE/ADA: New Consensus Statement on Inpatient Glycemic Control (2009)  Target Ranges:  Prepandial:   less than 140 mg/dL      Peak postprandial:   less than 180 mg/dL (1-2 hours)      Critically ill patients:  140 - 180 mg/dL   Reason for Visit: Fasting hyperlgycemia and high HgBA1C  Inpatient Diabetes Program Recommendations Insulin - Basal: Pt may well need Lantus at discharge.  HgBA1C at11.4%. If to be discharged home on Lantus, please order insulin instruction for Lantus pen. Correction (SSI): xxx Also, pt not eating enough to get meal coverage.  Best to change correction scale to q 4 hrs. Until eating.  Note: RN's to do bedside instruction/education per ed'l videos, insulin pen starter kit. May want to order RD consult for diet education as well. Thank you, Lenor Coffin, RN, CNS, Diabetes Coordinator [phone])    09-06-11 Diabetes Coordinator  MD, please see A1C results and need for probably basal insulin at home.  Pt has insurance that would cover her Lantus at home significantly.

## 2011-09-06 NOTE — Progress Notes (Signed)
Clinical Social Worker made a referral to Financial Counseling Merry Proud ph# [phone]) to provided financial assistance.  CSW attempted to met with pt, but pt was not available.  CSW to continue to follow and assist as needed.   Angelia Mould, MSW, [place] [phone]
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
## 2011-09-06 NOTE — Progress Notes (Signed)
[facility]  Subjective  - No complaints  Objective 148/73 95 98.7 F (37.1 C) (Oral) 18 94% No intake or output data in the 24 hours ending 09/06/11 0845  Abdomen soft non tender  Assessment/Planning: Needs CTA chest to eval thoracic aorta ABI pending Coumadin started awaiting INR 2-3 Leukocytosis trending down Hyperglycemia still greater than 200 Creatinine normal  FIELDS,CHARLES E 09/06/2011 8:45 AM --  Laboratory Lab Results:  Basename 09/06/11 0520 09/05/11 0300  WBC 19.6* 26.1*  HGB 13.2 13.1  HCT 38.4 37.1  PLT 165 220   BMET  Basename 09/06/11 0520 09/05/11 0300  NA 130* 132*  K 3.7 4.7  CL 98 102  CO2 20 16*  GLUCOSE 229* 309*  BUN 13 16  CREATININE 0.69 1.19*  CALCIUM 8.3* 7.8*    COAG Lab Results  Component Value Date   INR 1.22 09/06/2011   INR 1.13 09/05/2011   No results found for this basename: PTT    Antibiotics Anti-infectives    None

## 2011-09-06 NOTE — Progress Notes (Signed)
ANTICOAGULATION CONSULT NOTE - Follow Up Consult  Pharmacy Consult for Heparin and Coumadin Indication: aortic thrombus with renal/splenic emboli  Allergies  Allergen Reactions  . Celebrex (Celecoxib) Anaphylaxis  . Codeine Other (See Comments)    Violently ill  . Erythromycin Nausea And Vomiting  . Lactose Intolerance (Gi) Other (See Comments)    GI upset  . Penicillins Nausea Only    Patient Measurements: Height: 5\' 7"  (170.2 cm) Weight: 246 lb 0.5 oz (111.6 kg) IBW/kg (Calculated) : 61.6  Heparin Dosing Weight: 86.3 kg  Vital Signs: Temp: 99 F (37.2 C) (01/17 0201) Temp src: Oral (01/17 0201) BP: 139/73 mmHg (01/17 0201) Pulse Rate: 105  (01/17 0201)  Labs:  Basename 09/05/11 2357 09/05/11 1617 09/05/11 0940 09/05/11 0300 09/04/11 1600 09/04/11 1529  HGB -- -- -- 13.1 -- 15.0  HCT -- -- -- 37.1 -- 42.8  PLT -- -- -- 220 -- 305  APTT -- -- -- -- -- --  LABPROT -- 14.7 -- -- -- --  INR -- 1.13 -- -- -- --  HEPARINUNFRC 0.25* 0.17* 0.33 -- -- --  CREATININE -- -- -- 1.19* -- 1.20*  CKTOTAL -- -- -- -- 58 --  CKMB -- -- -- -- 3.3 --  TROPONINI -- -- -- -- <0.30 --   Estimated Creatinine Clearance: 70.4 ml/min (by C-G formula based on Cr of 1.19).   Medications:  Scheduled:     . aspirin  324 mg Oral NOW   Or  . aspirin  300 mg Rectal NOW  . buPROPion  75 mg Oral BID  . coumadin book   Does not apply Once  . glipiZIDE  5 mg Oral Q breakfast  . heparin  3,000 Units Intravenous Once  . heparin  3,000 Units Intravenous Once  . insulin aspart  0-15 Units Subcutaneous TID WC  . insulin aspart  0-5 Units Subcutaneous QHS  . insulin aspart  4 Units Subcutaneous TID WC  . insulin aspart  6 Units Subcutaneous Once  . nicotine  21 mg Transdermal QHS  . traZODone  100 mg Oral QHS  . venlafaxine  75 mg Oral QPM  . warfarin  10 mg Oral Once  . warfarin   Does not apply Once  . white petrolatum        Assessment: Heparin remains subtherapeutic at 0.25. Rate at  2000 units per hour. No bleeding complications Goal of Therapy:  Heparin level 0.3-0.7 units/ml INR 2-3   Plan:  Increase to 2300 units per hour and repeat HL in 6 hours Janice Coffin 09/06/2011,2:26 AM

## 2011-09-07 LAB — GLUCOSE, CAPILLARY
Glucose-Capillary: 264 mg/dL — ABNORMAL HIGH (ref 70–99)
Glucose-Capillary: 264 mg/dL — ABNORMAL HIGH (ref 70–99)

## 2011-09-07 LAB — HEPARIN LEVEL (UNFRACTIONATED)
Heparin Unfractionated: 0.41 IU/mL (ref 0.30–0.70)
Heparin Unfractionated: 0.4 IU/mL (ref 0.30–0.70)

## 2011-09-07 LAB — CBC
HCT: 40 % (ref 36.0–46.0)
Hemoglobin: 14 g/dL (ref 12.0–15.0)
MCHC: 35 g/dL (ref 30.0–36.0)
MCV: 86.4 fL (ref 78.0–100.0)
RDW: 13 % (ref 11.5–15.5)

## 2011-09-07 LAB — PROTIME-INR: INR: 2.41 — ABNORMAL HIGH (ref 0.00–1.49)

## 2011-09-07 LAB — CARDIOLIPIN ANTIBODIES, IGG, IGM, IGA: Anticardiolipin IgG: 4 GPL U/mL — ABNORMAL LOW (ref <23)

## 2011-09-07 LAB — LUPUS ANTICOAGULANT PANEL
Lupus Anticoagulant: NOT DETECTED
PTTLA Confirmation: 6.8 secs (ref <8.0)
dRVVT Incubated 1:1 Mix: 37.8 secs (ref 34.1–42.2)

## 2011-09-07 LAB — BETA-2-GLYCOPROTEIN I ABS, IGG/M/A
Beta-2-Glycoprotein I IgA: 12 A Units (ref <20)
Beta-2-Glycoprotein I IgM: 29 M Units — ABNORMAL HIGH (ref <20)

## 2011-09-07 LAB — PROTEIN S ACTIVITY: Protein S Activity: 69 % (ref 69–129)

## 2011-09-07 LAB — PROTEIN S, TOTAL: Protein S Ag, Total: 145 % (ref 60–150)

## 2011-09-07 LAB — PROTEIN C, TOTAL: Protein C, Total: 43 % — ABNORMAL LOW (ref 72–160)

## 2011-09-07 MED ORDER — INSULIN PEN STARTER KIT
1.0000 | Freq: Once | Status: AC
  Administered 2011-09-07: 1
  Filled 2011-09-07: qty 1

## 2011-09-07 MED ORDER — PANTOPRAZOLE SODIUM 40 MG PO TBEC
80.0000 mg | DELAYED_RELEASE_TABLET | Freq: Every day | ORAL | Status: DC
  Administered 2011-09-07 – 2011-09-10 (×4): 80 mg via ORAL
  Filled 2011-09-07: qty 1
  Filled 2011-09-07 (×2): qty 2
  Filled 2011-09-07: qty 1
  Filled 2011-09-07: qty 2

## 2011-09-07 NOTE — Progress Notes (Signed)
Clinical Social Worker met with pt at bedside and provided emotional support.  CSW reviewed deep breathing activities; pt states she has been practicing and found it helpful.  Pt also stated that she has found nicotine patches helpful. Additionally, pt reports that an HR representative from her job visited pt in the hospital and helped pt completed "short term illness" paperwork.  CSW provided opportunity for pt to process through difficult feelings related to anxiety, grief, and loss.  Pt agreed to consider making an appointment with an individual therapist to further assist with these feelings.  Pt stated "I will be better once I get home".  CSW validated the difficulty of a hospitalization in conjunction with other life stressors such as financial.  CSW to continue to follow and assist as needed.   Angelia Mould, MSW, [place] [phone]

## 2011-09-07 NOTE — Progress Notes (Signed)
   CARE MANAGEMENT NOTE 09/07/2011  Patient:  Kristy, Stewart   Account Number:  [id number]  Date Initiated:  09/05/2011  Documentation initiated by:  [facility]  Subjective/Objective Assessment:   Admittedwith thrombus in the lower thoracic and upper abdominal aorta with evidence of infarcts to the spleen and lower pole of R kidney     Action/Plan:   PTA, PT INDEPENDENT, LIVES ALONE.   Anticipated DC Date:  09/10/2011   Anticipated DC Plan:  HOME/SELF CARE  In-house referral  Clinical Social Worker      DC Planning Services  CM consult      Choice offered to / List presented to:          [facility] arranged  HH-1 RN      [facility] agency  [facility]   Status of service:  In process, will continue to follow Medicare Important Message given?   (If response is "NO", the following Medicare IM given date fields will be blank) Date Medicare IM given:   Date Additional Medicare IM given:    Discharge Disposition:    Per UR Regulation:  Reviewed for med. necessity/level of care/duration of stay  Comments:  09/07/11 15:39 Letha Cape RN, BSN [phone] Patient may be dc on Monday, will need pt/inr draw, tried to schedule with [facility] clinic, they were closed. Set up with [facility]  for [facility] to do blood draw on Tuesday or Wed and send results to [employer]. Patient also states she will try to call the clinic on Monday and make the appt and if she makes the appt she will cancel with [facility].  09/05/11 JULIE AMERSON,RN,BSN 1500 MET WITH PT AND CSW, AT REQUEST OF MD AND BEDSIDE RN.  PT VERY ANXIOUS; HAS FINANCIAL CONCERNS REGARDING MEDICATIONS AND HOSPITAL BILL.  CSW PROVIDED EMOTIONAL SUPPORT AND METHODS FOR RELIEVING ANXIETY.  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  REASSURED PT AND ASSISTED HER IN VERBALIZING CONCERNS. PT STATES SHE HAS RECENTLY BEEN PUT ON INSULIN, AND HER COPAYS FOR STRIPS, NEEDLES AND INSULIN ARE VERY  EXPENSIVE.  CASE MANAGEMENT WILL INVESTIGATE ASSISTANCE PROGRAMS OR  POSSIBILITY OF CHANGING MEDS TO LESS EXPENSIVE ALTERNATIVES.  WILL FOLLOW UP.  09-05-11 1:40pm Kristy Stewart, RNBSN [phone] UR Completed.

## 2011-09-07 NOTE — Progress Notes (Signed)
Subjective: Feels much better wants to go home. Tolerating diet. Objective: Filed Vitals:   09/06/11 1700 09/06/11 2137 09/07/11 0200 09/07/11 0510  BP: 148/88 112/52 116/47 123/69  Pulse: 99 109 104 99  Temp: 98.4 F (36.9 C) 98.4 F (36.9 C) 98 F (36.7 C) 98.6 F (37 C)  TempSrc: Oral     Resp: 16 18 18 18   Height:      Weight:      SpO2: 94% 90% 93% 91%   Weight change:   Intake/Output Summary (Last 24 hours) at 09/07/11 1311 Last data filed at 09/07/11 0510  Gross per 24 hour  Intake    581 ml  Output      3 ml  Net    578 ml    General: Alert, awake, oriented x3, in no acute distress.  HEENT: No bruits, no goiter.  Heart: Regular rate and rhythm, without murmurs, rubs, gallops.  Lungs: Good air movement CTA b/l Abdomen: Soft, nontender, nondistended, positive bowel sounds.  Neuro: Grossly intact, nonfocal.   Lab Results:  Basename 09/06/11 0520 09/05/11 0300 09/04/11 1529  NA 130* 132* 140  K 3.7 4.7 3.7  CL 98 102 102  CO2 20 16* 22  GLUCOSE 229* 309* 178*  BUN 13 16 13   CREATININE 0.69 1.19* 1.20*  CALCIUM 8.3* 7.8* 8.9  MG -- -- --  PHOS -- -- --    Basename 09/04/11 1529  AST 18  ALT 11  ALKPHOS 98  BILITOT 0.3  PROT 6.4  ALBUMIN 3.4*    Basename 09/04/11 1529  LIPASE 34  AMYLASE --    Basename 09/07/11 0600 09/06/11 0520 09/04/11 1529  WBC 14.1* 19.6* --  NEUTROABS -- -- 7.9*  HGB 14.0 13.2 --  HCT 40.0 38.4 --  MCV 86.4 85.9 --  PLT 144* 165 --    Basename 09/04/11 1600  CKTOTAL 58  CKMB 3.3  CKMBINDEX --  TROPONINI <0.30    Basename 09/05/11 1231  HGBA1C 11.4*   Micro Results: Recent Results (from the past 240 hour(s))  CULTURE, BLOOD (ROUTINE X 2)     Status: Normal (Preliminary result)   Collection Time   09/04/11  4:10 PM      Component Value Range Status Comment   Specimen Description BLOOD ARM LEFT   Final    Special Requests BOTTLES DRAWN AEROBIC AND ANAEROBIC 10CC   Final    Setup Time [id number]   Final     Culture     Final    Value:        BLOOD CULTURE RECEIVED NO GROWTH TO DATE CULTURE WILL BE HELD FOR 5 DAYS BEFORE ISSUING A FINAL NEGATIVE REPORT   Report Status PENDING   Incomplete   CULTURE, BLOOD (ROUTINE X 2)     Status: Normal (Preliminary result)   Collection Time   09/04/11  4:15 PM      Component Value Range Status Comment   Specimen Description BLOOD ARM LEFT   Final    Special Requests BOTTLES DRAWN AEROBIC AND ANAEROBIC 10CC   Final    Setup Time [id number]   Final    Culture     Final    Value:        BLOOD CULTURE RECEIVED NO GROWTH TO DATE CULTURE WILL BE HELD FOR 5 DAYS BEFORE ISSUING A FINAL NEGATIVE REPORT   Report Status PENDING   Incomplete   MRSA PCR SCREENING     Status: Normal   Collection  Time   09/05/11  1:50 AM      Component Value Range Status Comment   MRSA by PCR NEGATIVE  NEGATIVE  Final     Studies/Results: Ct Angio Chest W/cm &/or Wo Cm  09/06/2011  *RADIOLOGY REPORT*  Clinical Data: Aortic thrombus and right renal and splenic infarcts.  CT ANGIOGRAPHY CHEST  Technique:  Multidetector CT imaging of the chest using the standard protocol during bolus administration of intravenous contrast. Multiplanar reconstructed images including MIPs were obtained and reviewed to evaluate the vascular anatomy.  Contrast:  130 ml Omnipaque 350 IV  Comparison: None.  Findings: The thoracic aorta shows no evidence of aneurysmal disease.  There is partially ulcerated appearing calcified plaque along the inferior luminal surface of the mid aortic arch.  At the level of this plaque, irregular nonocclusive thrombus is also seen extending up into the lumen of the aorta.  Maximal dimensions of thrombus are approximately 15 mm.  This could certainly be the source of thrombus causing renal and splenic infarcts.  No other definite aortic thrombus is identified by CTA.  No evidence of aortic dissection.  No abnormal fluid collections are identified.  Heart size is normal.  The lungs  demonstrate scattered areas of subsegmental atelectasis in both lower lung zones.  No evidence of edema, focal infiltrate or pulmonary nodule. Trace left pleural fluid.  No enlarged lymph nodes are  identified.  IMPRESSION: Irregular and partially ulcerated appearing calcified plaque along the inferior luminal surface of the mid aortic arch.  There is associated nonocclusive thrombus at this level extending up into the lumen of the aorta.  Original Report Authenticated By: Reola Calkins, M.D.   Ct Angio Ao+bifem W/cm &/or Wo/cm  09/06/2011  *RADIOLOGY REPORT*  Clinical Data:  Splenic and right renal infarcts with aortic thrombus visualized by prior CTA.  CT ANGIOGRAPHY OF ABDOMINAL AORTA WITH ILIOFEMORAL RUNOFF  Technique:  Multidetector CT imaging of the abdomen, pelvis and lower extremities was performed using the standard protocol during bolus administration of intravenous contrast.  Multiplanar CT image reconstructions including MIPs were obtained to evaluate the vascular anatomy.  Contrast:   130 ml Omnipaque 350 IV  Comparison:  Abdominal and pelvic CTA dated 09/04/2011  Findings:  Aorta:  Suggestion of nonocclusive thrombus at the origin of the celiac axis is no longer present.  There is evidence of evolution of the multiple splenic infarcts.  It is estimated that approximately 60 - 70% of the entire volume of the spleen is infarcted.  The superior mesenteric artery and visualized branches are normally patent. Bilateral single renal arteries show normal patency. Perfusion defect in the lateral aspect of the mid to lower right kidney seen previously is less prominent and there is no residual significant infarct identified of the kidney.  A minimal amount of cortical perfusion abnormality is present in the location of previous abnormality and there likely has been some reperfusion of that renal segment since the prior study.  Irregular, adherent nonocclusive thrombus is again noted in the anterior lumen  of the abdominal aorta just superior to the takeoff of the inferior mesenteric artery.  This thrombus is stable in appearance and causes approximately 40% luminal stenosis.  The inferior mesenteric artery remains normally patent.  No evidence of aortic dissection.  No para-aortic inflammatory changes or evidence of retroperitoneal infection.  Right Lower Extremity:  Iliac arteries are normally patent.  The common femoral artery and femoral bifurcation show normal patency. The SFA, profunda femoral artery and popliteal  artery are normally patent.  Below the knee, normally patent  posterior tibial artery runoff is noted.  The peroneal artery becomes occluded in the proximal calf and does shows some distal reconstitution.  The anterior tibial artery is initially patent but then becomes occluded over a segment of the mid to distal calf.  A peroneal collateral does reconstitute the dorsalis pedis artery.  Left Lower Extremity:  Iliac arteries, common femoral artery, profunda femoral artery, superficial femoral artery and popliteal arteries show normal patency.  Anterior tibial and posterior tibial arteries are continuously patent.  The peroneal artery is open into the distal calf but it is fairly poorly visualized just above the ankle joint.   Review of the MIP images confirms the above findings.  IMPRESSION:  1.  Evolving infarcts of the spleen with roughly 60 - 70% of the entire splenic volume showing evidence of infarction by CT. 2.  No further evidence of nonocclusive thrombus in the lumen of the celiac trunk. 3.  Reperfusion of the right lateral renal cortex with only a small perfusion defect now noted at the level of prior visualized infarct.  No significant renal infarcts are present. 4.  Stable adherent nonocclusive thrombus in the anterior lumen of the lower abdominal aorta just above the IMA origin causing roughly 40% luminal stenosis. 5.  Segmental occlusions of the right peroneal and anterior tibial arteries.   Original Report Authenticated By: Reola Calkins, M.D.    Medications: I have reviewed the patient's current medications.  Assessment and plan: Principal Problem: 1.Aortic Thrombus:  continue heparin and coumadin. Vascular following. Agree with Ct angio of chest:  Irregular and partially ulcerated appearing calcified plaque along the inferior luminal surface of the mid aortic arch.  Antithrombin 3 negative. The most likely cause of this thrombus is ulcerated plaque seen on CT chest, agree that will need significant overlap heparin/lovenox d/w patient. appreciated vascular surgery assistance.  2. Renal infarct: Conservative management.  3.Splenic infarct Conservative management.   4.Mesenteric ischemia No further abdominal pain. 5. Diabetes mellitus  add lantus avoid metformin for now.  6. Nausea and vomitting Resolved. ? Mesenteric ischecmia. Check LDH.   LOS: 3 days   Marinda Elk M.D. Pager: [phone] [facility] 09/07/2011, 1:11 PM

## 2011-09-07 NOTE — Progress Notes (Addendum)
ANTICOAGULATION CONSULT NOTE - Follow Up Consult  Pharmacy Consult for Coumadin with Full-dose Heparin bridging  Indication: aortic thrombus with renal/splenic emboli   Allergies  Allergen Reactions  . Celebrex (Celecoxib) Anaphylaxis  . Codeine Other (See Comments)    Violently ill; reports having taken Vicodin previously without problems  . Erythromycin Nausea And Vomiting  . Lactose Intolerance (Gi) Other (See Comments)    GI upset  . Penicillins Nausea Only    Patient Measurements: Height: 5\' 7"  (170.2 cm) Weight: 246 lb 0.5 oz (111.6 kg) IBW/kg (Calculated) : 61.6  Adjusted Body Weight:   Vital Signs: Temp: 99.2 F (37.3 C) (01/18 1348) Temp src: Oral (01/18 1348) BP: 132/90 mmHg (01/18 1348) Pulse Rate: 107  (01/18 1348)  Labs:  Basename 09/07/11 1504 09/07/11 0600 09/06/11 2037 09/06/11 0520 09/05/11 1617 09/05/11 0300  HGB -- 14.0 -- 13.2 -- --  HCT -- 40.0 -- 38.4 -- 37.1  PLT -- 144* -- 165 -- 220  APTT -- -- -- -- -- --  LABPROT -- 26.6* -- 15.7* 14.7 --  INR -- 2.41* -- 1.22 1.13 --  HEPARINUNFRC 0.41 0.22* 0.31 -- -- --  CREATININE -- -- -- 0.69 -- 1.19*  CKTOTAL -- -- -- -- -- --  CKMB -- -- -- -- -- --  TROPONINI -- -- -- -- -- --   Estimated Creatinine Clearance: 104.8 ml/min (by C-G formula based on Cr of 0.69).   Medications:  Scheduled:     . buPROPion  75 mg Oral BID  . Flexpen Starter Kit  1 kit Other Once  . glipiZIDE  5 mg Oral Q breakfast  . heparin  3,000 Units Intravenous Once  . insulin aspart  0-15 Units Subcutaneous TID WC  . insulin aspart  0-5 Units Subcutaneous QHS  . insulin aspart  4 Units Subcutaneous TID WC  . insulin aspart  6 Units Subcutaneous Once  . insulin glargine  15 Units Subcutaneous QHS  . nicotine  21 mg Transdermal QHS  . pantoprazole  80 mg Oral Q1200  . traZODone  100 mg Oral QHS  . venlafaxine  75 mg Oral QPM  . warfarin  10 mg Oral ONCE-1800  . warfarin   Does not apply Once   Infusions:     .  heparin 28 mL/hr (09/07/11 1405)    Assessment: 56 yo F on heparin --> Coumadin for aortic thrombus with renal/splenic emboli.  Heparin level therapeutic for goal. Noted our aim is to keep the patient mid to high-end of goal. Heparin bridge continues for now, to ensure proper clotting factor depletion.   Goal of Therapy:  Heparin level 0.3-0.7 iu/ml   Plan:  1. Continue heparin drip at 2800 units/hr, recheck heparin level at 2100 today to ensure still within goal range.  Meera K. Allena Katz, PharmD, BCPS.  Clinical Pharmacist Pager [phone]. 09/07/2011 5:47 PM    PM Follow-up:  Repeat Heparin level at 2100 remained therapeutic at 0.41.  Will continue heparin at 2800 units/hr.  Follow-up with AM labs.  [employer], Pharm.D., BCPS Clinical Pharmacist Pager [phone]

## 2011-09-07 NOTE — Progress Notes (Signed)
Inpatient Diabetes Program Recommendations  AACE/ADA: New Consensus Statement on Inpatient Glycemic Control (2009)  Target Ranges:  Prepandial:   less than 140 mg/dL      Peak postprandial:   less than 180 mg/dL (1-2 hours)      Critically ill patients:  140 - 180 mg/dL   CBGs 16/10: 960/ 454/ 217 mg/dl CBGs today: 098/ 119 mg/dl  Inpatient Diabetes Program Recommendations Insulin - Basal: Noted Lantus 15 units QHS started last evening.  Please titrate Lantus according to fasting sugar levels in AM. Correction (SSI): . Insulin - Meal Coverage: Please increase Novolog meal coverage to 6 units tid with meals.  Note: Have ordered insulin pen instruction for this patient in case MD decides to send pt home on insulin (A1C 11.4%).  Pt does have insurance coverage and could afford insulin pens.

## 2011-09-07 NOTE — Progress Notes (Signed)
PT Cancellation  And Discharge Note  Treatment cancelled today due to patient's refusal to participate. Pt reporting no need for PT evaluation. Acute PT signing off.    Caitlin Ainley 09/07/2011, 9:01 AM Theron Arista L. Palma Buster DPT [phone]

## 2011-09-07 NOTE — Progress Notes (Signed)
[facility]  Subjective  - Feels better, wants to go home, still with left upper quadrant pain with coughing   Objective 123/69 99 98.6 F (37 C) (Oral) 18 91%  Intake/Output Summary (Last 24 hours) at 09/07/11 1043 Last data filed at 09/07/11 0510  Gross per 24 hour  Intake    581 ml  Output      3 ml  Net    578 ml   Abdomen soft non tender  CT angio chest abd pelvis- penetrating ulcer with thrombus adjacent to left subclavian origin, persistant thrombus upper abdominal aorta, evolving splenic infarction, 1 vessel runoff to right foot, 2 vessel runoff to left foot, 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  Would continue anticoagulation and will give this a chance to heal.  If ulcer persists or becomes more symptomatic will consider thoracic stent graft.  My partner Dr Hart Rochester on call this weekend if questions otherwise I will see the patient again Monday.  Agree with pharmacy on need for significant heparin coumadin therapeutic overlap prior to discharge.  Fabienne Bruns, MD [facility] Office: [phone] Pager: [phone]  Sherren Kerns 09/07/2011 10:43 AM --  Laboratory Lab Results:  Basename 09/07/11 0600 09/06/11 0520  WBC 14.1* 19.6*  HGB 14.0 13.2  HCT 40.0 38.4  PLT 144* 165   BMET  Basename 09/06/11 0520 09/05/11 0300  NA 130* 132*  K 3.7 4.7  CL 98 102  CO2 20 16*  GLUCOSE 229* 309*  BUN 13 16  CREATININE 0.69 1.19*  CALCIUM 8.3* 7.8*    COAG Lab Results  Component Value Date   INR 2.41* 09/07/2011   INR 1.22 09/06/2011   INR 1.13 09/05/2011   No results found for this basename: PTT    Antibiotics Anti-infectives    None

## 2011-09-07 NOTE — Progress Notes (Signed)
ANTICOAGULATION CONSULT NOTE - Follow Up Consult  Pharmacy Consult for Coumadin with Full-dose Heparin bridging  Indication: aortic thrombus with renal/splenic emboli   Allergies  Allergen Reactions  . Celebrex (Celecoxib) Anaphylaxis  . Codeine Other (See Comments)    Violently ill; reports having taken Vicodin previously without problems  . Erythromycin Nausea And Vomiting  . Lactose Intolerance (Gi) Other (See Comments)    GI upset  . Penicillins Nausea Only    Patient Measurements: Height: 5\' 7"  (170.2 cm) Weight: 246 lb 0.5 oz (111.6 kg) IBW/kg (Calculated) : 61.6  Adjusted Body Weight:   Vital Signs: Temp: 98.6 F (37 C) (01/18 0510) BP: 123/69 mmHg (01/18 0510) Pulse Rate: 99  (01/18 0510)  Labs:  Basename 09/07/11 0600 09/06/11 2037 09/06/11 0918 09/06/11 0520 09/05/11 1617 09/05/11 0300 09/04/11 1600 09/04/11 1529  HGB 14.0 -- -- 13.2 -- -- -- --  HCT 40.0 -- -- 38.4 -- 37.1 -- --  PLT 144* -- -- 165 -- 220 -- --  APTT -- -- -- -- -- -- -- --  LABPROT 26.6* -- -- 15.7* 14.7 -- -- --  INR 2.41* -- -- 1.22 1.13 -- -- --  HEPARINUNFRC 0.22* 0.31 0.39 -- -- -- -- --  CREATININE -- -- -- 0.69 -- 1.19* -- 1.20*  CKTOTAL -- -- -- -- -- -- 58 --  CKMB -- -- -- -- -- -- 3.3 --  TROPONINI -- -- -- -- -- -- <0.30 --   Estimated Creatinine Clearance: 104.8 ml/min (by C-G formula based on Cr of 0.69).   Medications:  Scheduled:    . buPROPion  75 mg Oral BID  . glipiZIDE  5 mg Oral Q breakfast  . heparin  3,000 Units Intravenous Once  . insulin aspart  0-15 Units Subcutaneous TID WC  . insulin aspart  0-5 Units Subcutaneous QHS  . insulin aspart  4 Units Subcutaneous TID WC  . insulin aspart  6 Units Subcutaneous Once  . insulin glargine  15 Units Subcutaneous QHS  . nicotine  21 mg Transdermal QHS  . traZODone  100 mg Oral QHS  . venlafaxine  75 mg Oral QPM  . warfarin  10 mg Oral ONCE-1800  . warfarin   Does not apply Once   Infusions:    . heparin  2,550 Units/hr (09/06/11 2156)    Assessment: 56 yo F on heparin --> Coumadin for aortic thrombus with renal/splenic emboli. AM heparin level is now subtherapeutic. With clot burden, would like for patient to be in the mid-to-high end of therapeutic goal. No bleeding complications noted. Did see a significant increase in INR 1.1->1.2->2.41 today. Would still continue heparin bridge for at minimum of 5 days to ensure proper clotting factor depletion.   Goal of Therapy:  INR 2-3 Heparin level 0.3-0.7 units/ml   Plan:  1.Hold coumadin today.  2.Small increase in heparin gtt - 2800 units/hr 3.Recheck HL in 6 hours  Severiano Gilbert 09/07/2011,7:13 AM

## 2011-09-08 LAB — CBC
HCT: 36.7 % (ref 36.0–46.0)
Hemoglobin: 13.1 g/dL (ref 12.0–15.0)
MCH: 30.9 pg (ref 26.0–34.0)
MCHC: 35.7 g/dL (ref 30.0–36.0)
RDW: 13 % (ref 11.5–15.5)

## 2011-09-08 LAB — GLUCOSE, CAPILLARY
Glucose-Capillary: 206 mg/dL — ABNORMAL HIGH (ref 70–99)
Glucose-Capillary: 174 mg/dL — ABNORMAL HIGH (ref 70–99)

## 2011-09-08 MED ORDER — INSULIN GLARGINE 100 UNIT/ML ~~LOC~~ SOLN: 10.0000 [IU] | Freq: Every day | SUBCUTANEOUS | Status: DC

## 2011-09-08 MED ORDER — INSULIN GLARGINE 100 UNIT/ML ~~LOC~~ SOLN
30.0000 [IU] | Freq: Every day | SUBCUTANEOUS | Status: DC
  Administered 2011-09-08: 30 [IU] via SUBCUTANEOUS

## 2011-09-08 MED ORDER — WARFARIN SODIUM 2.5 MG PO TABS
2.5000 mg | ORAL_TABLET | Freq: Once | ORAL | Status: AC
  Administered 2011-09-08: 2.5 mg via ORAL
  Filled 2011-09-08: qty 1

## 2011-09-08 MED ORDER — INSULIN ASPART 100 UNIT/ML ~~LOC~~ SOLN
0.0000 [IU] | Freq: Three times a day (TID) | SUBCUTANEOUS | Status: DC
  Administered 2011-09-08 (×3): 5 [IU] via SUBCUTANEOUS
  Administered 2011-09-09 (×3): 8 [IU] via SUBCUTANEOUS
  Administered 2011-09-10: 5 [IU] via SUBCUTANEOUS
  Administered 2011-09-10: 3 [IU] via SUBCUTANEOUS

## 2011-09-08 MED ORDER — INSULIN GLARGINE 100 UNIT/ML ~~LOC~~ SOLN: 15.0000 [IU] | Freq: Every day | SUBCUTANEOUS | Status: DC

## 2011-09-08 MED ORDER — GLIPIZIDE ER 5 MG PO TB24
5.0000 mg | ORAL_TABLET | Freq: Every day | ORAL | Status: DC
  Administered 2011-09-08 – 2011-09-10 (×3): 5 mg via ORAL
  Filled 2011-09-08 (×4): qty 1

## 2011-09-08 MED ORDER — INSULIN ASPART 100 UNIT/ML ~~LOC~~ SOLN
4.0000 [IU] | Freq: Three times a day (TID) | SUBCUTANEOUS | Status: DC
  Administered 2011-09-08 – 2011-09-10 (×5): 4 [IU] via SUBCUTANEOUS

## 2011-09-08 NOTE — Progress Notes (Signed)
ANTICOAGULATION CONSULT NOTE - Follow Up Consult  Pharmacy Consult for Coumadin with Full-dose Heparin bridging  Indication: aortic thrombus with renal/splenic emboli   Allergies  Allergen Reactions  . Celebrex (Celecoxib) Anaphylaxis  . Codeine Other (See Comments)    Violently ill; reports having taken Vicodin previously without problems  . Erythromycin Nausea And Vomiting  . Lactose Intolerance (Gi) Other (See Comments)    GI upset  . Penicillins Nausea Only    Patient Measurements: Height: 5\' 7"  (170.2 cm) Weight: 246 lb 0.5 oz (111.6 kg) IBW/kg (Calculated) : 61.6  Heparin dosing Wt: 87.4kg   Vital Signs: Temp: 97.6 F (36.4 C) (01/19 0616) Temp src: Oral (01/19 0616) BP: 128/78 mmHg (01/19 0616) Pulse Rate: 90  (01/19 0616)  Labs:  Basename 09/08/11 0634 09/07/11 2050 09/07/11 1504 09/07/11 0600 09/06/11 0520  HGB 13.1 -- -- 14.0 --  HCT 36.7 -- -- 40.0 38.4  PLT 163 -- -- 144* 165  APTT -- -- -- -- --  LABPROT 26.8* -- -- 26.6* 15.7*  INR 2.43* -- -- 2.41* 1.22  HEPARINUNFRC 0.31 0.40 0.41 -- --  CREATININE -- -- -- -- 0.69  CKTOTAL -- -- -- -- --  CKMB -- -- -- -- --  TROPONINI -- -- -- -- --   Estimated Creatinine Clearance: 104.8 ml/min (by C-G formula based on Cr of 0.69).   Medications:  Scheduled:     . buPROPion  75 mg Oral BID  . glipiZIDE  5 mg Oral Q breakfast  . heparin  3,000 Units Intravenous Once  . insulin aspart  0-15 Units Subcutaneous TID WC  . insulin aspart  0-5 Units Subcutaneous QHS  . insulin aspart  4 Units Subcutaneous TID WC  . insulin glargine  15 Units Subcutaneous QHS  . nicotine  21 mg Transdermal QHS  . pantoprazole  80 mg Oral Q1200  . traZODone  100 mg Oral QHS  . venlafaxine  75 mg Oral QPM  . warfarin   Does not apply Once  . DISCONTD: glipiZIDE  5 mg Oral Q breakfast  . DISCONTD: insulin aspart  0-15 Units Subcutaneous TID WC  . DISCONTD: insulin aspart  4 Units Subcutaneous TID WC  . DISCONTD: insulin  aspart  6 Units Subcutaneous Once   Infusions:     . heparin 2,800 Units/hr (09/08/11 0331)    Assessment: 56 yo F on heparin --> Coumadin for aortic thrombus with renal/splenic emboli. Overlap day #4/5.  Heparin level therapeutic. Heparin bridge continues for now, to ensure proper clotting factor depletion. INR also remains therapeutic today. No bleeding noted.   Goal of Therapy:  Heparin level 0.3-0.7 iu/ml   Plan:  1. Continue heparin drip at 2800 units/hr. May d/c heparin on Sunday 1/20 if INR remains therapeutic. 2) resume warfarin at 2.5mg  today. 3) F/U morning labs  Junita Push, PharmD, BCPS  09/08/2011 1:31 PM

## 2011-09-08 NOTE — Progress Notes (Signed)
Subjective: -Feels much better.  -Tolerating diet.  Objective: Filed Vitals:   09/07/11 1348 09/07/11 2208 09/08/11 0616 09/08/11 1509  BP: 132/90 118/89 128/78 139/86  Pulse: 107 102 90 71  Temp: 99.2 F (37.3 C) 98.3 F (36.8 C) 97.6 F (36.4 C) 98.2 F (36.8 C)  TempSrc: Oral Oral Oral Oral  Resp: 16 16 16 16   Height:      Weight:      SpO2: 96% 95% 93% 95%   Weight change:   Intake/Output Summary (Last 24 hours) at 09/08/11 1705 Last data filed at 09/08/11 1510  Gross per 24 hour  Intake 619.91 ml  Output   3000 ml  Net -2380.09 ml    General: Alert, awake, oriented x3, in no acute distress.  HEENT: No bruits, no goiter.  Heart: Regular rate and rhythm, without murmurs, rubs, gallops.  Lungs: Good air movement CTA b/l Abdomen: Soft, nontender, nondistended, positive bowel sounds.  Neuro: Grossly intact, nonfocal.   Lab Results:  [facility] 09/06/11 0520  NA 130*  K 3.7  CL 98  CO2 20  GLUCOSE 229*  BUN 13  CREATININE 0.69  CALCIUM 8.3*  MG --  PHOS --    Basename 09/08/11 0634 09/07/11 0600  WBC 13.8* 14.1*  NEUTROABS -- --  HGB 13.1 14.0  HCT 36.7 40.0  MCV 86.6 86.4  PLT 163 144*   Micro Results: Recent Results (from the past 240 hour(s))  CULTURE, BLOOD (ROUTINE X 2)     Status: Normal (Preliminary result)   Collection Time   09/04/11  4:10 PM      Component Value Range Status Comment   Specimen Description BLOOD ARM LEFT   Final    Special Requests BOTTLES DRAWN AEROBIC AND ANAEROBIC 10CC   Final    Setup Time [id number]   Final    Culture     Final    Value:        BLOOD CULTURE RECEIVED NO GROWTH TO DATE CULTURE WILL BE HELD FOR 5 DAYS BEFORE ISSUING A FINAL NEGATIVE REPORT   Report Status PENDING   Incomplete   CULTURE, BLOOD (ROUTINE X 2)     Status: Normal (Preliminary result)   Collection Time   09/04/11  4:15 PM      Component Value Range Status Comment   Specimen Description BLOOD ARM LEFT   Final    Special Requests BOTTLES  DRAWN AEROBIC AND ANAEROBIC 10CC   Final    Setup Time [id number]   Final    Culture     Final    Value:        BLOOD CULTURE RECEIVED NO GROWTH TO DATE CULTURE WILL BE HELD FOR 5 DAYS BEFORE ISSUING A FINAL NEGATIVE REPORT   Report Status PENDING   Incomplete   MRSA PCR SCREENING     Status: Normal   Collection Time   09/05/11  1:50 AM      Component Value Range Status Comment   MRSA by PCR NEGATIVE  NEGATIVE  Final     Studies/Results: No results found.  Medications: I have reviewed the patient's current medications.  Assessment and plan: Principal Problem: 1.Aortic Thrombus:  continue heparin and coumadin. Vascular following. Antithrombin 3 negative. significant overlap heparin/lovenox d/w patient. appreciated vascular surgery assistance.  2. Renal infarct: Conservative management.  3.Splenic infarct Conservative management.  Narcotics. 4.Mesenteric ischemia No further abdominal pain. 5. Diabetes mellitus  Increase lantus avoid metformin for now. In case surgery needed or additional  contrast.    LOS: 4 days   Marinda Elk M.D. Pager: [phone] [facility] 09/08/2011, 5:05 PM

## 2011-09-09 LAB — GLUCOSE, CAPILLARY
Glucose-Capillary: 252 mg/dL — ABNORMAL HIGH (ref 70–99)
Glucose-Capillary: 257 mg/dL — ABNORMAL HIGH (ref 70–99)
Glucose-Capillary: 186 mg/dL — ABNORMAL HIGH (ref 70–99)

## 2011-09-09 LAB — CBC
HCT: 37.8 % (ref 36.0–46.0)
MCHC: 34.4 g/dL (ref 30.0–36.0)
MCV: 86.7 fL (ref 78.0–100.0)
RDW: 12.8 % (ref 11.5–15.5)

## 2011-09-09 MED ORDER — POLYETHYLENE GLYCOL 3350 17 G PO PACK
17.0000 g | PACK | Freq: Every day | ORAL | Status: AC
  Administered 2011-09-09 – 2011-09-10 (×2): 17 g via ORAL
  Filled 2011-09-09 (×2): qty 1

## 2011-09-09 MED ORDER — INSULIN GLARGINE 100 UNIT/ML ~~LOC~~ SOLN
40.0000 [IU] | Freq: Every day | SUBCUTANEOUS | Status: DC
  Administered 2011-09-09: 40 [IU] via SUBCUTANEOUS

## 2011-09-09 MED ORDER — WARFARIN SODIUM 2.5 MG PO TABS
2.5000 mg | ORAL_TABLET | Freq: Once | ORAL | Status: AC
  Administered 2011-09-09: 2.5 mg via ORAL
  Filled 2011-09-09: qty 1

## 2011-09-09 MED ORDER — SENNOSIDES-DOCUSATE SODIUM 8.6-50 MG PO TABS
2.0000 | ORAL_TABLET | Freq: Two times a day (BID) | ORAL | Status: DC
  Administered 2011-09-09 – 2011-09-10 (×3): 2 via ORAL
  Filled 2011-09-09 (×2): qty 2
  Filled 2011-09-09 (×2): qty 1

## 2011-09-09 NOTE — Progress Notes (Signed)
ANTICOAGULATION CONSULT NOTE - Follow Up Consult  Pharmacy Consult for Coumadin with Full-dose Heparin bridging  Indication: aortic thrombus with renal/splenic emboli   Allergies  Allergen Reactions  . Celebrex (Celecoxib) Anaphylaxis  . Codeine Other (See Comments)    Violently ill; reports having taken Vicodin previously without problems  . Erythromycin Nausea And Vomiting  . Lactose Intolerance (Gi) Other (See Comments)    GI upset  . Penicillins Nausea Only    Patient Measurements: Height: 5\' 7"  (170.2 cm) Weight: 246 lb 0.5 oz (111.6 kg) IBW/kg (Calculated) : 61.6  Heparin dosing Wt: 87.4kg   Vital Signs: Temp: 98 F (36.7 C) (01/20 0612) Temp src: Oral (01/20 0612) BP: 153/88 mmHg (01/20 0612) Pulse Rate: 97  (01/20 0612)  Labs:  Basename 09/09/11 0612 09/08/11 0634 09/07/11 2050 09/07/11 0600  HGB 13.0 13.1 -- --  HCT 37.8 36.7 -- 40.0  PLT 221 163 -- 144*  APTT -- -- -- --  LABPROT 25.9* 26.8* -- 26.6*  INR 2.32* 2.43* -- 2.41*  HEPARINUNFRC 0.41 0.31 0.40 --  CREATININE -- -- -- --  CKTOTAL -- -- -- --  CKMB -- -- -- --  TROPONINI -- -- -- --   Estimated Creatinine Clearance: 104.8 ml/min (by C-G formula based on Cr of 0.69).   Medications:  Scheduled:     . buPROPion  75 mg Oral BID  . glipiZIDE  5 mg Oral Q breakfast  . heparin  3,000 Units Intravenous Once  . insulin aspart  0-15 Units Subcutaneous TID WC  . insulin aspart  0-5 Units Subcutaneous QHS  . insulin aspart  4 Units Subcutaneous TID WC  . insulin glargine  30 Units Subcutaneous QHS  . nicotine  21 mg Transdermal QHS  . pantoprazole  80 mg Oral Q1200  . traZODone  100 mg Oral QHS  . venlafaxine  75 mg Oral QPM  . warfarin  2.5 mg Oral ONCE-1800  . DISCONTD: insulin glargine  10 Units Subcutaneous QHS  . DISCONTD: insulin glargine  15 Units Subcutaneous QHS  . DISCONTD: insulin glargine  15 Units Subcutaneous QHS   Infusions:     . heparin 2,800 Units/hr (09/09/11 0102)     Assessment: 56 yo F on heparin --> Coumadin for aortic thrombus with renal/splenic emboli. Overlap day #5/5.  Heparin level therapeutic. Heparin bridge continues for now to ensure proper clotting factor depletion. INR also remains therapeutic today. No bleeding noted.   Goal of Therapy:  Heparin level 0.3-0.7 iu/ml   Plan:  1. Continue heparin drip at 2800 units/hr. May d/c heparin today adequate overlap completed. 2) Continue warfarin at 2.5mg  today. 3) F/U morning labs *4) Would recommend d/c patient home with warfarin 5mg  tab & instruction to take 1/2 tab daily.  Close outpatient INR monitoring will be needed until warfarin levels stabilized at steady-state. *5) Consider resuming patient's home BP med of lisinopril HCT as BP now elevated.   Junita Push, PharmD, BCPS  09/09/2011 12:08 PM

## 2011-09-09 NOTE — Progress Notes (Signed)
Subjective: No complains    Objective: Filed Vitals:   09/08/11 0616 09/08/11 1509 09/08/11 2201 09/09/11 0612  BP: 128/78 139/86 119/71 153/88  Pulse: 90 71 94 97  Temp: 97.6 F (36.4 C) 98.2 F (36.8 C) 98.3 F (36.8 C) 98 F (36.7 C)  TempSrc: Oral Oral Oral Oral  Resp: 16 16 17 18   Height:      Weight:      SpO2: 93% 95% 94% 92%   Weight change:   Intake/Output Summary (Last 24 hours) at 09/09/11 1351 Last data filed at 09/09/11 0659  Gross per 24 hour  Intake    208 ml  Output   1000 ml  Net   -792 ml    General: Alert, awake, oriented x3, in no acute distress.  HEENT: No bruits, no goiter.  Heart: Regular rate and rhythm, without murmurs, rubs, gallops.  Lungs: Good air movement CTA b/l Abdomen: Soft, nontender, nondistended, positive bowel sounds.  Neuro: Grossly intact, nonfocal.   Lab Results:   Basename 09/09/11 0612 09/08/11 0634  WBC 13.2* 13.8*  NEUTROABS -- --  HGB 13.0 13.1  HCT 37.8 36.7  MCV 86.7 86.6  PLT 221 163   Micro Results: Recent Results (from the past 240 hour(s))  CULTURE, BLOOD (ROUTINE X 2)     Status: Normal (Preliminary result)   Collection Time   09/04/11  4:10 PM      Component Value Range Status Comment   Specimen Description BLOOD ARM LEFT   Final    Special Requests BOTTLES DRAWN AEROBIC AND ANAEROBIC 10CC   Final    Setup Time [id number]   Final    Culture     Final    Value:        BLOOD CULTURE RECEIVED NO GROWTH TO DATE CULTURE WILL BE HELD FOR 5 DAYS BEFORE ISSUING A FINAL NEGATIVE REPORT   Report Status PENDING   Incomplete   CULTURE, BLOOD (ROUTINE X 2)     Status: Normal (Preliminary result)   Collection Time   09/04/11  4:15 PM      Component Value Range Status Comment   Specimen Description BLOOD ARM LEFT   Final    Special Requests BOTTLES DRAWN AEROBIC AND ANAEROBIC 10CC   Final    Setup Time [id number]   Final    Culture     Final    Value:        BLOOD CULTURE RECEIVED NO GROWTH TO DATE CULTURE  WILL BE HELD FOR 5 DAYS BEFORE ISSUING A FINAL NEGATIVE REPORT   Report Status PENDING   Incomplete   MRSA PCR SCREENING     Status: Normal   Collection Time   09/05/11  1:50 AM      Component Value Range Status Comment   MRSA by PCR NEGATIVE  NEGATIVE  Final     Studies/Results: No results found.  Medications: I have reviewed the patient's current medications.  Assessment and plan: Principal Problem: 1.Aortic Thrombus: continue heparin and coumadin. Vascular following. Day 5 of overlap.  2. Renal infarct: Conservative management.  3.Splenic infarct Conservative management.  Narcotics.  4.Mesenteric ischemia No further abdominal pain.  5. Diabetes mellitus  Increase lantus avoid metformin for now. In case surgery needed or additional contrast.    LOS: 5 days   Marinda Elk M.D. Pager: [phone] [facility] 09/09/2011, 1:51 PM

## 2011-09-10 LAB — PROTIME-INR
INR: 2.02 — ABNORMAL HIGH (ref 0.00–1.49)
Prothrombin Time: 23.2 seconds — ABNORMAL HIGH (ref 11.6–15.2)

## 2011-09-10 LAB — GLUCOSE, CAPILLARY: Glucose-Capillary: 221 mg/dL — ABNORMAL HIGH (ref 70–99)

## 2011-09-10 LAB — CBC
MCHC: 32.8 g/dL (ref 30.0–36.0)
Platelets: 278 10*3/uL (ref 150–400)
RDW: 12.8 % (ref 11.5–15.5)

## 2011-09-10 MED ORDER — WARFARIN SODIUM 5 MG PO TABS
5.0000 mg | ORAL_TABLET | Freq: Once | ORAL | Status: DC
  Filled 2011-09-10: qty 1

## 2011-09-10 MED ORDER — WARFARIN SODIUM 5 MG PO TABS: 5.0000 mg | ORAL_TABLET | Freq: Once | ORAL | Status: DC

## 2011-09-10 MED ORDER — INSULIN DETEMIR 100 UNIT/ML ~~LOC~~ SOLN: 50.0000 [IU] | Freq: Every day | SUBCUTANEOUS | Status: DC

## 2011-09-10 MED ORDER — HYDROCODONE-ACETAMINOPHEN 5-325 MG PO TABS: 1.0000 | ORAL_TABLET | Freq: Four times a day (QID) | ORAL | Status: AC | PRN

## 2011-09-10 NOTE — Progress Notes (Signed)
   CARE MANAGEMENT NOTE 09/10/2011  Patient:  Kristy Stewart, Kristy Stewart   Account Number:  [id number]  Date Initiated:  09/05/2011  Documentation initiated by:  [facility]  Subjective/Objective Assessment:   Admittedwith thrombus in the lower thoracic and upper abdominal aorta with evidence of infarcts to the spleen and lower pole of R kidney     Action/Plan:   PTA, PT INDEPENDENT, LIVES ALONE.   Anticipated DC Date:  09/10/2011   Anticipated DC Plan:  HOME/SELF CARE  In-house referral  Clinical Social Worker      DC Planning Services  CM consult      Choice offered to / List presented to:             Status of service:  Completed, signed off Medicare Important Message given?   (If response is "NO", the following Medicare IM given date fields will be blank) Date Medicare IM given:   Date Additional Medicare IM given:    Discharge Disposition:  HOME/SELF CARE  Per UR Regulation:  Reviewed for med. necessity/level of care/duration of stay  Comments:  09/10/11 11:41 Letha Cape RN, BSN [phone] Patient is for dc today, patient wants me to fax script for pt/inr blood draw to [facility] [employer] where she works, she states they can draw her blood there,  I spoke with Mohammed Kindle at [facility] and she confirmed that they can draw patient 's blood and send to [place] [facility].  I faxed script over to [facility] and gave original script to patient.  I canceled the [facility] for pt/inr draw.   09/07/11 15:39 Letha Cape RN, BSN [phone] Patient may be dc on Monday, will need pt/inr draw, tried to schedule with [facility] clinic, they were closed. Set up with [facility]  for [facility] to do blood draw on Tuesday or Wed and send results to [facility]. Patient also states she will try to call the clinic on Monday and make the appt and if she makes the appt she will cancel with [facility].  09/05/11 JULIE AMERSON,RN,BSN 1500 MET WITH PT AND CSW, AT REQUEST OF MD AND BEDSIDE RN.  PT VERY ANXIOUS;  HAS FINANCIAL CONCERNS REGARDING MEDICATIONS AND HOSPITAL BILL.  CSW PROVIDED EMOTIONAL SUPPORT AND METHODS FOR RELIEVING ANXIETY.  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  REASSURED PT AND ASSISTED HER IN VERBALIZING CONCERNS. PT STATES SHE HAS RECENTLY BEEN PUT ON INSULIN, AND HER COPAYS FOR STRIPS, NEEDLES AND INSULIN ARE VERY EXPENSIVE.  CASE MANAGEMENT WILL INVESTIGATE ASSISTANCE PROGRAMS OR  POSSIBILITY OF CHANGING MEDS TO LESS EXPENSIVE ALTERNATIVES.  WILL FOLLOW UP.  09-05-11 1:40pm Avie Arenas, RNBSN [phone] UR Completed.

## 2011-09-10 NOTE — Progress Notes (Signed)
ANTICOAGULATION CONSULT NOTE - Follow Up Consult  Pharmacy Consult for heparin + coumadin Indication: aortic thrombus with renal/splenic emboli  Assessment: 55 yof on coumadin + heparin for aortic thrombus with renal/splenic emboli. Today is overlap day #6/5. Heparin level remains therapeutic and INR also remains therapeutic at 2.02 but is trending down a bit (likely due to held dose). No bleeding or other problems noted.   Goal of Therapy:  INR 2-3 Heparin level 0.3-0.7 units/ml   Plan:  1. Consider DC heparin gtt - continue at current rate for now until MD assesses 2. Coumadin 5mg  PO x 1 tonight 3. F/u AM INR  Vital Signs: Temp: 98.1 F (36.7 C) (01/21 0553) Temp src: Oral (01/21 0553) BP: 145/92 mmHg (01/21 0553) Pulse Rate: 93  (01/21 0553)  Labs:  Basename 09/10/11 0622 09/09/11 0612 09/08/11 0634  HGB 12.6 13.0 --  HCT 38.4 37.8 36.7  PLT 278 221 163  APTT -- -- --  LABPROT 23.2* 25.9* 26.8*  INR 2.02* 2.32* 2.43*  HEPARINUNFRC 0.33 0.41 0.31  CREATININE -- -- --  CKTOTAL -- -- --  CKMB -- -- --  TROPONINI -- -- --   Estimated Creatinine Clearance: 104.8 ml/min (by C-G formula based on Cr of 0.69).   Kristy Stewart, Kristy Stewart 09/10/2011,9:51 AM

## 2011-09-10 NOTE — Discharge Summary (Signed)
Admit date: 09/04/2011 Discharge date: 09/10/2011  Primary Care Physician:  Herb Grays, MD, MD   Discharge Diagnoses:      DISCHARGE MEDICATION: Current Discharge Medication List    START taking these medications   Details  HYDROcodone-acetaminophen (NORCO) 5-325 MG per tablet Take 1-2 tablets by mouth every 6 (six) hours as needed. Qty: 30 tablet, Refills: 0    insulin detemir (LEVEMIR) 100 UNIT/ML injection Inject 50 Units into the skin at bedtime. Qty: 10 mL, Refills: 12    warfarin (COUMADIN) 5 MG tablet Take 1 tablet (5 mg total) by mouth one time only at 6 PM. Qty: 30 tablet, Refills: 0      CONTINUE these medications which have NOT CHANGED   Details  albuterol (PROVENTIL HFA;VENTOLIN HFA) 108 (90 BASE) MCG/ACT inhaler Inhale 2 puffs into the lungs every 4 (four) hours as needed for wheezing (cough). Qty: 1 Inhaler, Refills: 0    ALPRAZolam (XANAX) 0.5 MG tablet Take 0.25 mg by mouth 2 (two) times daily as needed. For anxiety     buPROPion (WELLBUTRIN) 75 MG tablet Take 75 mg by mouth 2 (two) times daily.      celecoxib (CELEBREX) 200 MG capsule Take 200 mg by mouth 2 (two) times daily.    cholestyramine (QUESTRAN) 4 GM/DOSE powder Take 4 g by mouth every morning.      esomeprazole (NEXIUM) 40 MG capsule Take 40 mg by mouth 2 (two) times daily.      glipiZIDE (GLUCOTROL XL) 2.5 MG 24 hr tablet Take 2.5-5 mg by mouth 2 (two) times daily. 5 mg in the morning and 2.5 mg at night     lisinopril-hydrochlorothiazide (PRINZIDE,ZESTORETIC) 20-12.5 MG per tablet Take 1 tablet by mouth every morning.      loperamide (IMODIUM) 2 MG capsule Take 2 mg by mouth once.      OVER THE COUNTER MEDICATION Place 2 drops into both eyes daily as needed. Saline drop for dry eyes     traZODone (DESYREL) 100 MG tablet Take 100 mg by mouth at bedtime.      venlafaxine (EFFEXOR-XR) 75 MG 24 hr capsule Take 75 mg by mouth every evening.        STOP taking these medications     dextromethorphan-guaiFENesin (MUCINEX DM) 30-600 MG per 12 hr tablet      medroxyPROGESTERone (DEPO-PROVERA) 150 MG/ML injection      sulfamethoxazole-trimethoprim (BACTRIM DS) 800-160 MG per tablet          Consults: Treatment Team:  Sherren Kerns, MD   SIGNIFICANT DIAGNOSTIC STUDIES:  Dg Chest 2 View  08/28/2011  *RADIOLOGY REPORT*  Clinical Data: Flu symptoms, cough, vomiting  CHEST - 2 VIEW  Comparison: None.  Findings: Normal heart size and vascularity.  Slight diffuse interstitial prominence, nonspecific.  Negative for edema, pneumonia, collapse, consolidation, effusion or pneumothorax. Trachea midline.  Mild thoracic spondylosis.  IMPRESSION: Nonspecific interstitial prominence.  No acute chest process  Original Report Authenticated By: Judie Petit. Ruel Favors, M.D.   Ct Angio Chest W/cm &/or Wo Cm  09/06/2011  *RADIOLOGY REPORT*  Clinical Data: Aortic thrombus and right renal and splenic infarcts.  CT ANGIOGRAPHY CHEST  Technique:  Multidetector CT imaging of the chest using the standard protocol during bolus administration of intravenous contrast. Multiplanar reconstructed images including MIPs were obtained and reviewed to evaluate the vascular anatomy.  Contrast:  130 ml Omnipaque 350 IV  Comparison: None.  Findings: The thoracic aorta shows no evidence of aneurysmal disease.  There is  partially ulcerated appearing calcified plaque along the inferior luminal surface of the mid aortic arch.  At the level of this plaque, irregular nonocclusive thrombus is also seen extending up into the lumen of the aorta.  Maximal dimensions of thrombus are approximately 15 mm.  This could certainly be the source of thrombus causing renal and splenic infarcts.  No other definite aortic thrombus is identified by CTA.  No evidence of aortic dissection.  No abnormal fluid collections are identified.  Heart size is normal.  The lungs demonstrate scattered areas of subsegmental atelectasis in both lower lung zones.   No evidence of edema, focal infiltrate or pulmonary nodule. Trace left pleural fluid.  No enlarged lymph nodes are  identified.  IMPRESSION: Irregular and partially ulcerated appearing calcified plaque along the inferior luminal surface of the mid aortic arch.  There is associated nonocclusive thrombus at this level extending up into the lumen of the aorta.  Original Report Authenticated By: Reola Calkins, M.D.   Ct Angio Ao+bifem W/cm &/or Wo/cm  09/06/2011  *RADIOLOGY REPORT*  Clinical Data:  Splenic and right renal infarcts with aortic thrombus visualized by prior CTA.  CT ANGIOGRAPHY OF ABDOMINAL AORTA WITH ILIOFEMORAL RUNOFF  Technique:  Multidetector CT imaging of the abdomen, pelvis and lower extremities was performed using the standard protocol during bolus administration of intravenous contrast.  Multiplanar CT image reconstructions including MIPs were obtained to evaluate the vascular anatomy.  Contrast:   130 ml Omnipaque 350 IV  Comparison:  Abdominal and pelvic CTA dated 09/04/2011  Findings:  Aorta:  Suggestion of nonocclusive thrombus at the origin of the celiac axis is no longer present.  There is evidence of evolution of the multiple splenic infarcts.  It is estimated that approximately 60 - 70% of the entire volume of the spleen is infarcted.  The superior mesenteric artery and visualized branches are normally patent. Bilateral single renal arteries show normal patency. Perfusion defect in the lateral aspect of the mid to lower right kidney seen previously is less prominent and there is no residual significant infarct identified of the kidney.  A minimal amount of cortical perfusion abnormality is present in the location of previous abnormality and there likely has been some reperfusion of that renal segment since the prior study.  Irregular, adherent nonocclusive thrombus is again noted in the anterior lumen of the abdominal aorta just superior to the takeoff of the inferior mesenteric  artery.  This thrombus is stable in appearance and causes approximately 40% luminal stenosis.  The inferior mesenteric artery remains normally patent.  No evidence of aortic dissection.  No para-aortic inflammatory changes or evidence of retroperitoneal infection.  Right Lower Extremity:  Iliac arteries are normally patent.  The common femoral artery and femoral bifurcation show normal patency. The SFA, profunda femoral artery and popliteal artery are normally patent.  Below the knee, normally patent  posterior tibial artery runoff is noted.  The peroneal artery becomes occluded in the proximal calf and does shows some distal reconstitution.  The anterior tibial artery is initially patent but then becomes occluded over a segment of the mid to distal calf.  A peroneal collateral does reconstitute the dorsalis pedis artery.  Left Lower Extremity:  Iliac arteries, common femoral artery, profunda femoral artery, superficial femoral artery and popliteal arteries show normal patency.  Anterior tibial and posterior tibial arteries are continuously patent.  The peroneal artery is open into the distal calf but it is fairly poorly visualized just above the ankle joint.  Review of the MIP images confirms the above findings.  IMPRESSION:  1.  Evolving infarcts of the spleen with roughly 60 - 70% of the entire splenic volume showing evidence of infarction by CT. 2.  No further evidence of nonocclusive thrombus in the lumen of the celiac trunk. 3.  Reperfusion of the right lateral renal cortex with only a small perfusion defect now noted at the level of prior visualized infarct.  No significant renal infarcts are present. 4.  Stable adherent nonocclusive thrombus in the anterior lumen of the lower abdominal aorta just above the IMA origin causing roughly 40% luminal stenosis. 5.  Segmental occlusions of the right peroneal and anterior tibial arteries.  Original Report Authenticated By: Reola Calkins, M.D.   Dg Chest  Portable 1 View  09/04/2011  *RADIOLOGY REPORT*  Clinical Data: Rule out free air  PORTABLE CHEST - 1 VIEW  Comparison: None  Findings: The heart size and mediastinal contours are within normal limits.  Both lungs are clear.  The visualized skeletal structures are unremarkable. No lucencies identified in the projection of the upper abdomen.  IMPRESSION: 1.  No acute cardiopulmonary abnormalities. 2.  Exam insufficient for ruling out "free air."  Recommend upright PA radiograph of the chest as well as dedicated abdominal series.  Original Report Authenticated By: Rosealee Albee, M.D.   Ct Angio Abd/pel W/ And/or W/o  09/04/2011  *RADIOLOGY REPORT*  Clinical Data:  Severe abdominal pain, nausea, vomiting, diarrhea  CT ANGIOGRAPHY ABDOMEN AND PELVIS  Technique:  Multidetector CT imaging of the abdomen and pelvis was performed using the standard protocol during bolus administration of intravenous contrast.  Multiplanar reconstructed images including MIPs were obtained and reviewed to evaluate the vascular anatomy.  Contrast: OMNIPAQUE IOHEXOL 300 MG/ML IV SOLN  Comparison:  09/18/2007 abdominal ultrasound  Findings:  Lower thoracic aorta demonstrates a small amount of hypodense eccentric wall plaque formation versus nonocclusive thrombus, image #2.  Otherwise the lower thoracic aorta is patent. Abdominal aorta demonstrates mild atherosclerotic changes without aneurysm or dissection.  No acute retroperitoneal hemorrhage.  Celiac origin is minimally narrowed by a superior aspect noncalcified plaque formation.  Degree of celiac origin narrowing is less than 50%.  Celiac branches remain patent.  SMA origin is widely patent.  SMA main ileocolic trunk is visualized and patent supplying jejunal and colic branches throughout the mesentery.  Exam is limited with some respiratory motion artifact through the abdomen.  Infrarenal abdominal aorta demonstrates anterior hypodense noncalcified plaque formation or thrombus  narrowing the aortic lumen approximately 50%, image 89.  This extends to the IMA origin which is not well visualized however the IMA itself is patent distally.  Aortic bifurcation is patent.  Pelvic iliac vessels demonstrate mild atherosclerosis but are patent.  Additional axial imaging:  Liver demonstrates no focal hepatic abnormality. Hepatic and portal veins are patent.  The gallbladder, biliary system, pancreas, adrenal glands, and left kidney within normal limits and demonstrate no acute finding.  Spleen demonstrates a patchy enhancement pattern even on the portal venous phase imaging.  This appears somewhat wedge-shaped and peripheral. Areas of multi focal splenic infarction could have this appearance possibly from an embolic process.  The tortuous main splenic artery appears patent.  Right kidney demonstrates a lower pole wedge shaped defect extending to the cortical margin, image 36 series 7 also suspicious for a renal infarct, possibly embolic.  Renal arteries both remain patent.  Negative for bowel obstruction, significant dilatation, ileus or free air. No visualized  bowel wall thickening.  colon is relatively decompressed.  Cecum is low lying in the pelvis.  Normal appendix identified containing air.  No abdominal or pelvic free fluid, fluid collection, hemorrhage, hematoma, adenopathy, inguinal abnormality, or hernia.  Mild diffuse degenerative changes of the spine.  No compression fracture.   Review of the MIP images confirms the above findings.  IMPRESSION: Heterogeneous enhancing pattern of the spleen which persist on the portal venous phase delayed imaging suspicious for multi focal splenic infarcts.  Wedge shaped defect right kidney lower pole suspicious for a renal infarct.  Finding suggest a possible embolic phenomenon.  Small nonocclusive noncalcified plaque formation or thrombus in the lower thoracic aorta and to a more pronounced degree in the infrarenal abdominal aorta.  Slight narrowing of  the celiac origin however the celiac, SMA and IMA appear patent.  Single renal arteries appear patent as well.  No CT evidence of bowel obstruction, bowel wall thickening, pneumatosis, or portal venous gas  Original Report Authenticated By: Judie Petit. Ruel Favors, M.D.     ECHO: Study Conclusions  Left ventricle: The cavity size was normal. Systolic function was normal. The estimated ejection fraction was 55%. Wall motion was normal; there were no regional wall motion abnormalities.      Recent Results (from the past 240 hour(s))  CULTURE, BLOOD (ROUTINE X 2)     Status: Normal (Preliminary result)   Collection Time   09/04/11  4:10 PM      Component Value Range Status Comment   Specimen Description BLOOD ARM LEFT   Final    Special Requests BOTTLES DRAWN AEROBIC AND ANAEROBIC 10CC   Final    Setup Time [id number]   Final    Culture     Final    Value:        BLOOD CULTURE RECEIVED NO GROWTH TO DATE CULTURE WILL BE HELD FOR 5 DAYS BEFORE ISSUING A FINAL NEGATIVE REPORT   Report Status PENDING   Incomplete   CULTURE, BLOOD (ROUTINE X 2)     Status: Normal (Preliminary result)   Collection Time   09/04/11  4:15 PM      Component Value Range Status Comment   Specimen Description BLOOD ARM LEFT   Final    Special Requests BOTTLES DRAWN AEROBIC AND ANAEROBIC 10CC   Final    Setup Time [id number]   Final    Culture     Final    Value:        BLOOD CULTURE RECEIVED NO GROWTH TO DATE CULTURE WILL BE HELD FOR 5 DAYS BEFORE ISSUING A FINAL NEGATIVE REPORT   Report Status PENDING   Incomplete   MRSA PCR SCREENING     Status: Normal   Collection Time   09/05/11  1:50 AM      Component Value Range Status Comment   MRSA by PCR NEGATIVE  NEGATIVE  Final     BRIEF ADMITTING H & P: 56 y/o female smoker with diabetes presents with on day of abdominal pain and nausea and vomiting. She states that for the last month she has had URI symptoms with sinus congestion and cough. Two weeks prior to  admission she was treated with bactrim for bronchitis. In the last few weeks she has also noted her heart racing with anxiety, and discussed it with a physician when she was seen for the bronchitis but was not told that she had A-fib. Today she developed the sudden onset of nausea and vomiting and cramping all  over abdominal pain mid morning. She was brought here and was found to have splenic and renal infarcts on CT angio.   Active Hospital Problems  Diagnoses Date Noted   . Aortic Thrombus: She was admitted to the ICU. She was started on heparin and coumadin vascular was consulted. CT angio of chest abdomen and pelvis was done with results as above.She was overlap for 6 days. No a fib on telemetry. ECHO no thrombus.  Was d/c and follow up with PCP on INR check this week. Goal 2-3. Follow up with dr. Darrick Penna in 2-4 weeks. 09/06/2011   . Diabetes mellitus: Poorly controlled levemir increase to 50 units. Follow up with her PCP. 09/04/2011     Resolved Hospital Problems  Diagnoses Date Noted Date Resolved  . 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 PAin controlled. Cr remained stable. 09/04/2011 09/10/2011  . Splenic infarct: Pain controlled. 09/04/2011 09/10/2011  . Hypercoagulable state: Most likely  Multifactorial. Smoking and contraceptive she has been advise to stop them. Needs to quit smoking. Counseling. Will resume to be defered To dr. Darrick Penna. 09/04/2011 09/10/2011  . Mesenteric ischemia: Resolved. Minimal pain. 09/04/2011 09/10/2011     Disposition and Follow-up:  Dr. Darrick Penna in 2-4 weeks.  Follow-up Information    Follow up with Herb Grays, MD in 1 week. (As needed)           DISCHARGE EXAM:  General: Alert, awake, oriented x3, in no acute distress.  HEENT: No bruits, no goiter.  Heart: Regular rate and rhythm, without murmurs, rubs, gallops.  Lungs: Good air movement CTA b/l  Abdomen: Soft, nontender, nondistended, positive bowel sounds.  Neuro: Grossly intact,  nonfocal.   Blood pressure 145/92, pulse 93, temperature 98.1 F (36.7 C), temperature source Oral, resp. rate 18, height 5\' 7"  (1.702 m), weight 111.6 kg (246 lb 0.5 oz), SpO2 93.00%.   Basename 09/10/11 0622 09/09/11 0612  WBC 11.9* 13.2*  NEUTROABS -- --  HGB 12.6 13.0  HCT 38.4 37.8  MCV 86.9 86.7  PLT 278 221    Signed: Marinda Elk M.D. 09/10/2011, 12:14 PM

## 2011-09-10 NOTE — Progress Notes (Signed)
   CARE MANAGEMENT NOTE 09/10/2011  Patient:  Kristy Stewart, Kristy Stewart   Account Number:  [id number]  Date Initiated:  09/05/2011  Documentation initiated by:  [facility]  Subjective/Objective Assessment:   Admittedwith thrombus in the lower thoracic and upper abdominal aorta with evidence of infarcts to the spleen and lower pole of R kidney     Action/Plan:   PTA, PT INDEPENDENT, LIVES ALONE.   Anticipated DC Date:  09/10/2011   Anticipated DC Plan:  HOME/SELF CARE  In-house referral  Clinical Social Worker      DC Planning Services  CM consult      Choice offered to / List presented to:             Status of service:  Completed, signed off Medicare Important Message given?   (If response is "NO", the following Medicare IM given date fields will be blank) Date Medicare IM given:   Date Additional Medicare IM given:    Discharge Disposition:  HOME/SELF CARE  Per UR Regulation:  Reviewed for med. necessity/level of care/duration of stay  Comments:  09/10/11 13:58 Letha Cape RN, BSN [phone] Received a call from [facility], Mohammed Kindle, stating they will not be able to do this, she thought they would but could not.  I faxed the script to [facility] and patient has appt for 1:30 on 1/22 for blood draw.  Called patient and left message on her vm regarding this information.   09/10/11 11:41 Letha Cape RN, BSN [phone] Patient is for dc today, patient wants me to fax script for pt/inr blood draw to [facility] [employer] where she works, she states they can draw her blood there,  I spoke with Mohammed Kindle at [facility] and she confirmed that they can draw patient 's blood and send to [facility].  I faxed script over to [facility] and gave original script to patient.  I canceled the [facility] for pt/inr draw.   09/07/11 15:39 Letha Cape RN, BSN [phone] Patient may be dc on Monday, will need pt/inr draw, tried to schedule with [facility],  they were closed. Set up with [facility]  for [facility] to do blood draw on Tuesday or Wed and send results to [facility]. Patient also states she will try to call the clinic on Monday and make the appt and if she makes the appt she will cancel with [facility].  09/05/11 JULIE AMERSON,RN,BSN 1500 MET WITH PT AND CSW, AT REQUEST OF MD AND BEDSIDE RN.  PT VERY ANXIOUS; HAS FINANCIAL CONCERNS REGARDING MEDICATIONS AND HOSPITAL BILL.  CSW PROVIDED EMOTIONAL SUPPORT AND METHODS FOR RELIEVING ANXIETY.  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  REASSURED PT AND ASSISTED HER IN VERBALIZING CONCERNS. PT STATES SHE HAS RECENTLY BEEN PUT ON INSULIN, AND HER COPAYS FOR STRIPS, NEEDLES AND INSULIN ARE VERY EXPENSIVE.  CASE MANAGEMENT WILL INVESTIGATE ASSISTANCE PROGRAMS OR  POSSIBILITY OF CHANGING MEDS TO LESS EXPENSIVE ALTERNATIVES.  WILL FOLLOW UP.  09-05-11 1:40pm Avie Arenas, RNBSN [phone] UR Completed.

## 2011-09-10 NOTE — Progress Notes (Signed)
Discharge instructions reviewed with pt and prescriptions given.  Pt verbalized understanding and had no questions.  Pt discharged in stable condition via wheelchair with family.  Chief Walkup [place]   

## 2011-09-13 ENCOUNTER — Other Ambulatory Visit: Payer: Self-pay | Admitting: *Deleted

## 2011-09-13 DIAGNOSIS — N280 Ischemia and infarction of kidney: Secondary | ICD-10-CM

## 2011-09-13 DIAGNOSIS — K551 Chronic vascular disorders of intestine: Secondary | ICD-10-CM

## 2011-09-24 LAB — CULTURE, BLOOD (ROUTINE X 2): Culture: NO GROWTH

## 2011-09-25 ENCOUNTER — Observation Stay (HOSPITAL_COMMUNITY): Payer: BC Managed Care – PPO

## 2011-09-25 ENCOUNTER — Encounter (HOSPITAL_COMMUNITY): Payer: Self-pay | Admitting: General Practice

## 2011-09-25 ENCOUNTER — Observation Stay (HOSPITAL_COMMUNITY)
Admission: AD | Admit: 2011-09-25 | Discharge: 2011-09-26 | Disposition: A | Discharge status: Home / Self Care | Payer: BC Managed Care – PPO | Source: Ambulatory Visit | Attending: Internal Medicine | Admitting: Internal Medicine

## 2011-09-25 DIAGNOSIS — F411 Generalized anxiety disorder: Secondary | ICD-10-CM | POA: Insufficient documentation

## 2011-09-25 DIAGNOSIS — D734 Cyst of spleen: Secondary | ICD-10-CM | POA: Diagnosis present

## 2011-09-25 DIAGNOSIS — F329 Major depressive disorder, single episode, unspecified: Secondary | ICD-10-CM | POA: Diagnosis present

## 2011-09-25 DIAGNOSIS — D735 Infarction of spleen: Secondary | ICD-10-CM

## 2011-09-25 DIAGNOSIS — E119 Type 2 diabetes mellitus without complications: Secondary | ICD-10-CM | POA: Diagnosis present

## 2011-09-25 DIAGNOSIS — F419 Anxiety disorder, unspecified: Secondary | ICD-10-CM | POA: Diagnosis present

## 2011-09-25 DIAGNOSIS — I7411 Embolism and thrombosis of thoracic aorta: Secondary | ICD-10-CM | POA: Insufficient documentation

## 2011-09-25 DIAGNOSIS — R109 Unspecified abdominal pain: Secondary | ICD-10-CM | POA: Insufficient documentation

## 2011-09-25 DIAGNOSIS — Z7901 Long term (current) use of anticoagulants: Secondary | ICD-10-CM | POA: Insufficient documentation

## 2011-09-25 DIAGNOSIS — R079 Chest pain, unspecified: Secondary | ICD-10-CM | POA: Insufficient documentation

## 2011-09-25 DIAGNOSIS — F32A Depression, unspecified: Secondary | ICD-10-CM | POA: Diagnosis present

## 2011-09-25 DIAGNOSIS — Z23 Encounter for immunization: Secondary | ICD-10-CM | POA: Insufficient documentation

## 2011-09-25 DIAGNOSIS — I8290 Acute embolism and thrombosis of unspecified vein: Secondary | ICD-10-CM | POA: Diagnosis present

## 2011-09-25 DIAGNOSIS — I748 Embolism and thrombosis of other arteries: Principal | ICD-10-CM | POA: Insufficient documentation

## 2011-09-25 DIAGNOSIS — Z720 Tobacco use: Secondary | ICD-10-CM

## 2011-09-25 DIAGNOSIS — M79609 Pain in unspecified limb: Secondary | ICD-10-CM | POA: Insufficient documentation

## 2011-09-25 HISTORY — DX: Personal history of other diseases of the digestive system: Z87.19

## 2011-09-25 HISTORY — DX: Personal history of other venous thrombosis and embolism: Z86.718

## 2011-09-25 HISTORY — DX: Bronchitis, not specified as acute or chronic: J40

## 2011-09-25 HISTORY — DX: Reserved for concepts with insufficient information to code with codable children: IMO0002

## 2011-09-25 HISTORY — DX: Unspecified injury of spleen, initial encounter: S36.00XA

## 2011-09-25 LAB — BASIC METABOLIC PANEL
Chloride: 98 mEq/L (ref 96–112)
Creatinine, Ser: 0.88 mg/dL (ref 0.50–1.10)
GFR calc Af Amer: 85 mL/min — ABNORMAL LOW (ref >90)
Potassium: 3.8 mEq/L (ref 3.5–5.1)
Sodium: 133 mEq/L — ABNORMAL LOW (ref 135–145)

## 2011-09-25 LAB — PROTIME-INR: Prothrombin Time: 18.7 seconds — ABNORMAL HIGH (ref 11.6–15.2)

## 2011-09-25 LAB — CBC
MCV: 85.8 fL (ref 78.0–100.0)
Platelets: 579 10*3/uL — ABNORMAL HIGH (ref 150–400)
RBC: 4.23 MIL/uL (ref 3.87–5.11)
RDW: 12.8 % (ref 11.5–15.5)
WBC: 7.1 10*3/uL (ref 4.0–10.5)

## 2011-09-25 LAB — GLUCOSE, CAPILLARY

## 2011-09-25 MED ORDER — LISINOPRIL 20 MG PO TABS
20.0000 mg | ORAL_TABLET | Freq: Every day | ORAL | Status: DC
  Administered 2011-09-26: 20 mg via ORAL
  Filled 2011-09-25: qty 1

## 2011-09-25 MED ORDER — HYDROCHLOROTHIAZIDE 12.5 MG PO CAPS
12.5000 mg | ORAL_CAPSULE | Freq: Every day | ORAL | Status: DC
  Administered 2011-09-26: 12.5 mg via ORAL
  Filled 2011-09-25: qty 1

## 2011-09-25 MED ORDER — INSULIN DETEMIR 100 UNIT/ML ~~LOC~~ SOLN
50.0000 [IU] | Freq: Every day | SUBCUTANEOUS | Status: DC
  Administered 2011-09-25: 50 [IU] via SUBCUTANEOUS
  Filled 2011-09-25: qty 3

## 2011-09-25 MED ORDER — WARFARIN SODIUM 2.5 MG PO TABS
2.5000 mg | ORAL_TABLET | Freq: Once | ORAL | Status: AC
  Administered 2011-09-25: 2.5 mg via ORAL
  Filled 2011-09-25: qty 1

## 2011-09-25 MED ORDER — BUPROPION HCL 75 MG PO TABS
75.0000 mg | ORAL_TABLET | Freq: Two times a day (BID) | ORAL | Status: DC
  Administered 2011-09-25 – 2011-09-26 (×2): 75 mg via ORAL
  Filled 2011-09-25 (×3): qty 1

## 2011-09-25 MED ORDER — PANTOPRAZOLE SODIUM 40 MG PO TBEC
80.0000 mg | DELAYED_RELEASE_TABLET | Freq: Every day | ORAL | Status: DC
  Administered 2011-09-25 – 2011-09-26 (×2): 80 mg via ORAL
  Filled 2011-09-25 (×2): qty 2

## 2011-09-25 MED ORDER — CHOLESTYRAMINE 4 GM/DOSE PO POWD: 4.0000 g | ORAL | Status: DC

## 2011-09-25 MED ORDER — PNEUMOCOCCAL VAC POLYVALENT 25 MCG/0.5ML IJ INJ
0.5000 mL | INJECTION | INTRAMUSCULAR | Status: AC
  Administered 2011-09-26: 0.5 mL via INTRAMUSCULAR
  Filled 2011-09-25: qty 0.5

## 2011-09-25 MED ORDER — ALPRAZOLAM 0.25 MG PO TABS: 0.2500 mg | ORAL_TABLET | Freq: Two times a day (BID) | ORAL | Status: DC | PRN

## 2011-09-25 MED ORDER — NICOTINE 21 MG/24HR TD PT24
21.0000 mg | MEDICATED_PATCH | TRANSDERMAL | Status: DC
  Administered 2011-09-26: 21 mg via TRANSDERMAL
  Filled 2011-09-25 (×2): qty 1

## 2011-09-25 MED ORDER — LOPERAMIDE HCL 2 MG PO CAPS
2.0000 mg | ORAL_CAPSULE | Freq: Once | ORAL | Status: AC
  Administered 2011-09-25: 2 mg via ORAL
  Filled 2011-09-25: qty 1

## 2011-09-25 MED ORDER — TRAZODONE HCL 100 MG PO TABS
100.0000 mg | ORAL_TABLET | Freq: Every day | ORAL | Status: DC
  Administered 2011-09-25: 100 mg via ORAL
  Filled 2011-09-25 (×2): qty 1

## 2011-09-25 MED ORDER — WARFARIN SODIUM 5 MG PO TABS
5.0000 mg | ORAL_TABLET | Freq: Once | ORAL | Status: DC
  Filled 2011-09-25: qty 1

## 2011-09-25 MED ORDER — ALBUTEROL SULFATE HFA 108 (90 BASE) MCG/ACT IN AERS
2.0000 | INHALATION_SPRAY | RESPIRATORY_TRACT | Status: DC | PRN
  Filled 2011-09-25: qty 6.7

## 2011-09-25 MED ORDER — LISINOPRIL-HYDROCHLOROTHIAZIDE 20-12.5 MG PO TABS: 1.0000 | ORAL_TABLET | ORAL | Status: DC

## 2011-09-25 MED ORDER — HYDROCODONE-ACETAMINOPHEN 5-325 MG PO TABS
1.0000 | ORAL_TABLET | ORAL | Status: DC | PRN
  Administered 2011-09-25: 2 via ORAL
  Administered 2011-09-26 (×2): 1 via ORAL
  Filled 2011-09-25: qty 1
  Filled 2011-09-25: qty 2
  Filled 2011-09-25: qty 1

## 2011-09-25 MED ORDER — CHOLESTYRAMINE LIGHT 4 G PO PACK
4.0000 g | PACK | Freq: Every day | ORAL | Status: DC
  Administered 2011-09-26: 4 g via ORAL
  Filled 2011-09-25: qty 1

## 2011-09-25 MED ORDER — VENLAFAXINE HCL ER 75 MG PO CP24
75.0000 mg | ORAL_CAPSULE | Freq: Every evening | ORAL | Status: DC
  Administered 2011-09-25: 75 mg via ORAL
  Filled 2011-09-25 (×2): qty 1

## 2011-09-25 NOTE — Progress Notes (Signed)
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 Codeine Other (See Comments)    Violently ill; reports having taken Vicodin previously without problems  . Erythromycin Nausea And Vomiting  . Lactose Intolerance (Gi) Other (See Comments)    GI upset  . Penicillins Nausea Only    Patient Measurements: Height: 5\' 7"  (170.2 cm) Weight: 233 lb 11 oz (106 kg) IBW/kg (Calculated) : 61.6    Vital Signs: Temp: 99.2 F (37.3 C) (02/05 2106) BP: 107/72 mmHg (02/05 2106) Pulse Rate: 93  (02/05 2106)  Labs:  Basename 09/25/11 2006 09/25/11 1926  HGB -- 12.7  HCT -- 36.3  PLT -- 579*  APTT -- --  LABPROT 18.7* --  INR 1.53* --  HEPARINUNFRC -- --  CREATININE -- 0.88  CKTOTAL -- --  CKMB -- --  TROPONINI -- --   Estimated Creatinine Clearance: 92.7 ml/min (by C-G formula based on Cr of 0.88).  Medical History: Past Medical History  Diagnosis Date  . Diabetes mellitus   . Arthritis   . Migraines   . Allergy   . GERD (gastroesophageal reflux disease)   . Anxiety   . Depression   . Hypertension     Medications:  Prescriptions prior to admission  Medication Sig Dispense Refill  . albuterol (PROVENTIL HFA;VENTOLIN HFA) 108 (90 BASE) MCG/ACT inhaler Inhale 2 puffs into the lungs every 4 (four) hours as needed. For shortness of breath      . ALPRAZolam (XANAX) 0.5 MG tablet Take 0.25 mg by mouth 2 (two) times daily as needed. For anxiety      . buPROPion (WELLBUTRIN) 75 MG tablet Take 75 mg by mouth 2 (two) times daily.        . celecoxib (CELEBREX) 200 MG capsule Take 200 mg by mouth 2 (two) times daily.      . cholestyramine (QUESTRAN) 4 GM/DOSE powder Take 4 g by mouth every morning.        Marland Kitchen esomeprazole (NEXIUM) 40 MG capsule Take 40 mg by mouth 2 (two) times daily.        Marland Kitchen glipiZIDE (GLUCOTROL) 10 MG tablet Take 10 mg by mouth 2 (two) times  daily before a meal.      . insulin detemir (LEVEMIR) 100 UNIT/ML injection Inject 50 Units into the skin at bedtime.      Marland Kitchen lisinopril-hydrochlorothiazide (PRINZIDE,ZESTORETIC) 20-12.5 MG per tablet Take 1 tablet by mouth every morning.        . nicotine (NICODERM CQ - DOSED IN MG/24 HOURS) 21 mg/24hr patch Place 1 patch onto the skin daily.      Marland Kitchen OVER THE COUNTER MEDICATION Place 2 drops into both eyes daily as needed. Saline drop for dry eyes       . traZODone (DESYREL) 100 MG tablet Take 100 mg by mouth at bedtime.        Marland Kitchen warfarin (COUMADIN) 5 MG tablet Take 5 mg by mouth one time only at 6 PM.      . DISCONTD: albuterol (PROVENTIL HFA;VENTOLIN HFA) 108 (90 BASE) MCG/ACT inhaler Inhale 2 puffs into the lungs every 4 (four) hours as needed for wheezing (cough).  1 Inhaler  0  . DISCONTD: glipiZIDE (GLUCOTROL XL) 2.5 MG 24 hr tablet Take 2.5-5 mg by mouth 2 (two) times daily. 5 mg in the morning and 2.5 mg at night      . DISCONTD:  insulin detemir (LEVEMIR) 100 UNIT/ML injection Inject 50 Units into the skin at bedtime.  10 mL  12  . DISCONTD: warfarin (COUMADIN) 5 MG tablet Take 1 tablet (5 mg total) by mouth one time only at 6 PM.  30 tablet  0  . DISCONTD: loperamide (IMODIUM) 2 MG capsule Take 2 mg by mouth once.        Marland Kitchen DISCONTD: venlafaxine (EFFEXOR-XR) 75 MG 24 hr capsule Take 75 mg by mouth every evening.          Assessment: 56 yo F on coumadin for aortic thrombus with splenic and renal infarcts admitted with abdominal pain. INR low today. Home dose 5mg  daily with last dose taken yesterday. Goal of Therapy:  INR 2-3   Plan:  MD had already ordered 5mg  daily so will order an extra 2.5mg  to make a total dose of 7.5mg  today. Daily INR.  Len Childs T 09/25/2011,9:27 PM

## 2011-09-25 NOTE — H&P (Signed)
PCP:   Herb Grays, MD, MD   Chief Complaint:  Left side abdominal pain  HPI: 56 year old female who was recently discharged from the hospital on 09/10/2011, after she was diagnosed with aortic thrombus, splenic and renal infarcts. Patient is currently on anti-coagulation with Coumadin, with INR therapeutic. Today patient went to the primary care office to get more pain medication as the Lortab she was prescribed from the hospital ran out last Wednesday. Patient says that the pain became worse after she stopped taking Lortab.Marland Kitchen Also patient was found to have low-grade temperature of 100.4 in the PCP office she was sent for further evaluation. Patient denies any chest pain, admits to having nausea vomiting today and admits to having severe left-sided pain with radiation to left arm and left chest wall. She was seen by vascular surgery Dr. Darrick Penna and has an appointment to see him on March 21. Patient denies any chills, no headache no blurred vision.  Allergies:   Allergies  Allergen Reactions  . Codeine Other (See Comments)    Violently ill; reports having taken Vicodin previously without problems  . Erythromycin Nausea And Vomiting  . Lactose Intolerance (Gi) Other (See Comments)    GI upset  . Penicillins Nausea Only      Past Medical History  Diagnosis Date  . Diabetes mellitus   . Arthritis   . Migraines   . Allergy   . GERD (gastroesophageal reflux disease)   . Anxiety   . Depression   . Hypertension     No past surgical history on file.  Prior to Admission medications   Medication Sig Start Date End Date Taking? Authorizing Provider  albuterol (PROVENTIL HFA;VENTOLIN HFA) 108 (90 BASE) MCG/ACT inhaler Inhale 2 puffs into the lungs every 4 (four) hours as needed. For shortness of breath 08/28/11 08/27/12 Yes Dione Booze, MD  ALPRAZolam Prudy Feeler) 0.5 MG tablet Take 0.25 mg by mouth 2 (two) times daily as needed. For anxiety   Yes Historical Provider, MD  buPROPion (WELLBUTRIN) 75  MG tablet Take 75 mg by mouth 2 (two) times daily.     Yes Historical Provider, MD  celecoxib (CELEBREX) 200 MG capsule Take 200 mg by mouth 2 (two) times daily.   Yes Historical Provider, MD  cholestyramine Lanetta Inch) 4 GM/DOSE powder Take 4 g by mouth every morning.     Yes Historical Provider, MD  esomeprazole (NEXIUM) 40 MG capsule Take 40 mg by mouth 2 (two) times daily.     Yes Historical Provider, MD  glipiZIDE (GLUCOTROL) 10 MG tablet Take 10 mg by mouth 2 (two) times daily before a meal.   Yes Historical Provider, MD  insulin detemir (LEVEMIR) 100 UNIT/ML injection Inject 50 Units into the skin at bedtime. 09/10/11 09/09/12 Yes Marinda Elk, MD  lisinopril-hydrochlorothiazide (PRINZIDE,ZESTORETIC) 20-12.5 MG per tablet Take 1 tablet by mouth every morning.     Yes Historical Provider, MD  nicotine (NICODERM CQ - DOSED IN MG/24 HOURS) 21 mg/24hr patch Place 1 patch onto the skin daily.   Yes Historical Provider, MD  OVER THE COUNTER MEDICATION Place 2 drops into both eyes daily as needed. Saline drop for dry eyes    Yes Historical Provider, MD  traZODone (DESYREL) 100 MG tablet Take 100 mg by mouth at bedtime.     Yes Historical Provider, MD  warfarin (COUMADIN) 5 MG tablet Take 5 mg by mouth one time only at 6 PM. 09/10/11 09/09/12 Yes Marinda Elk, MD    Social History:  reports that she has been smoking Cigarettes.  She has been smoking about 1 pack per day. She has never used smokeless tobacco. She reports that she does not drink alcohol or use illicit drugs.  No family history on file.  Review of Systems:  HEENT: Denies headache, blurred vision, runny nose, sore throat,  Neck: Denies thyroid problems,lymphadenopathy Chest : Positive shortness of breath, no history of COPD Heart : Denies Chest pain,  coronary arterey disease GI: Positive nausea, vomiting, no diarrhea, constipation GU: Denies dysuria, urgency, frequency of urination, hematuria Neuro: Denies stroke,  seizures, syncope Psych: Denies depression, anxiety, hallucinations   Physical Exam: Blood pressure 115/66, pulse 112, temperature 99.4 F (37.4 C), resp. rate 18, height 5\' 7"  (1.702 m), weight 106 kg (233 lb 11 oz), SpO2 96.00%. Constitutional:   Patient is a well-developed and well-nourished female in no acute distress and cooperative with exam. Head: Normocephalic and atraumatic Mouth: Mucus membranes moist Eyes: PERRL, EOMI, conjunctivae normal Neck: Supple, No Thyromegaly Cardiovascular: RRR, S1 normal, S2 normal Pulmonary/Chest: CTAB, no wheezes, rales, or rhonchi Abdominal: Soft. Non-tender, non-distended, bowel sounds are normal, no masses, organomegaly, or guarding present.  Neurological: A&O x3, Strenght is normal and symmetric bilaterally, cranial nerve II-XII are grossly intact, no focal motor deficit, sensory intact to light touch bilaterally.  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 We'll obtain CT angio  of the chest abdomen and pelvis to see if patient's infarction has become worse or she has developed an abscess. In the meantime will continue the patient on Lortab as her pain is well-controlled with Lortab as per patient  Diabetes mellitus Will continue sliding scale insulin.  Chronic anticoagulation for aortic thrombus Will continue Coumadin and also check CT angio to check the status of the aortic thrombus.    Time Spent on Admission: 60 min  Severino Paolo S [facility] Pager: [phone] 09/25/2011, 7:05 PM

## 2011-09-26 DIAGNOSIS — D735 Infarction of spleen: Secondary | ICD-10-CM | POA: Diagnosis present

## 2011-09-26 DIAGNOSIS — D734 Cyst of spleen: Secondary | ICD-10-CM | POA: Diagnosis present

## 2011-09-26 LAB — COMPREHENSIVE METABOLIC PANEL
AST: 43 U/L — ABNORMAL HIGH (ref 0–37)
Albumin: 2.8 g/dL — ABNORMAL LOW (ref 3.5–5.2)
Alkaline Phosphatase: 227 U/L — ABNORMAL HIGH (ref 39–117)
BUN: 11 mg/dL (ref 6–23)
CO2: 22 mEq/L (ref 19–32)
Chloride: 99 mEq/L (ref 96–112)
Potassium: 3.9 mEq/L (ref 3.5–5.1)
Total Bilirubin: 0.2 mg/dL — ABNORMAL LOW (ref 0.3–1.2)

## 2011-09-26 LAB — GLUCOSE, CAPILLARY
Glucose-Capillary: 245 mg/dL — ABNORMAL HIGH (ref 70–99)
Glucose-Capillary: 235 mg/dL — ABNORMAL HIGH (ref 70–99)
Glucose-Capillary: 268 mg/dL — ABNORMAL HIGH (ref 70–99)

## 2011-09-26 LAB — CBC
HCT: 36.4 % (ref 36.0–46.0)
RBC: 4.23 MIL/uL (ref 3.87–5.11)
RDW: 12.9 % (ref 11.5–15.5)
WBC: 7.8 10*3/uL (ref 4.0–10.5)

## 2011-09-26 LAB — PROTIME-INR: Prothrombin Time: 21.7 seconds — ABNORMAL HIGH (ref 11.6–15.2)

## 2011-09-26 MED ORDER — IOHEXOL 350 MG/ML SOLN
100.0000 mL | Freq: Once | INTRAVENOUS | Status: AC | PRN
  Administered 2011-09-26: 100 mL via INTRAVENOUS

## 2011-09-26 MED ORDER — VENLAFAXINE HCL ER 75 MG PO CP24: 75.0000 mg | ORAL_CAPSULE | Freq: Every evening | ORAL | Status: DC

## 2011-09-26 MED ORDER — INSULIN ASPART 100 UNIT/ML ~~LOC~~ SOLN
0.0000 [IU] | Freq: Three times a day (TID) | SUBCUTANEOUS | Status: DC
  Administered 2011-09-26: 5 [IU] via SUBCUTANEOUS
  Filled 2011-09-26: qty 3

## 2011-09-26 MED ORDER — WARFARIN SODIUM 7.5 MG PO TABS
7.5000 mg | ORAL_TABLET | Freq: Once | ORAL | Status: DC
  Filled 2011-09-26: qty 1

## 2011-09-26 MED ORDER — HYDROCODONE-ACETAMINOPHEN 5-325 MG PO TABS: 1.0000 | ORAL_TABLET | ORAL | Status: AC | PRN

## 2011-09-26 NOTE — Progress Notes (Signed)
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 Codeine Other (See Comments)    Violently ill; reports having taken Vicodin previously without problems  . Erythromycin Nausea And Vomiting  . Lactose Intolerance (Gi) Other (See Comments)    GI upset  . Penicillins Nausea Only    Patient Measurements: Height: 5\' 7"  (170.2 cm) Weight: 233 lb 14.5 oz (106.1 kg) IBW/kg (Calculated) : 61.6    Vital Signs: Temp: 99.5 F (37.5 C) (02/06 0500) BP: 128/83 mmHg (02/06 0500) Pulse Rate: 95  (02/06 0500)  Labs:  Basename 09/26/11 0606 09/25/11 2006 09/25/11 1926  HGB 12.5 -- 12.7  HCT 36.4 -- 36.3  PLT 536* -- 579*  APTT -- -- --  LABPROT 21.7* 18.7* --  INR 1.85* 1.53* --  HEPARINUNFRC -- -- --  CREATININE 0.83 -- 0.88  CKTOTAL -- -- --  CKMB -- -- --  TROPONINI -- -- --   Estimated Creatinine Clearance: 98.3 ml/min (by C-G formula based on Cr of 0.83).  Medical History: Past Medical History  Diagnosis Date  . Diabetes mellitus   . Arthritis   . Allergy   . GERD (gastroesophageal reflux disease)   . Anxiety   . Depression   . Hypertension   . 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 History of blood clots 08/2011    spleen and kidney; "from ulcer on my aorta"  . Bronchitis 08/2011  . H/O hiatal hernia   . Migraines     "gone since starting BP meds 2007"  . Spleen injury 08/2011    "from blood clot from ulcer on aorta"    Medications:  Prescriptions prior to admission  Medication Sig Dispense Refill  . albuterol (PROVENTIL HFA;VENTOLIN HFA) 108 (90 BASE) MCG/ACT inhaler Inhale 2 puffs into the lungs every 4 (four) hours as needed. For shortness of breath      . ALPRAZolam (XANAX) 0.5 MG tablet Take 0.25 mg by mouth 2 (two) times daily as needed. For anxiety      . buPROPion (WELLBUTRIN) 75 MG tablet  Take 75 mg by mouth 2 (two) times daily.        . celecoxib (CELEBREX) 200 MG capsule Take 200 mg by mouth 2 (two) times daily.      . cholestyramine (QUESTRAN) 4 GM/DOSE powder Take 4 g by mouth every morning.        Marland Kitchen esomeprazole (NEXIUM) 40 MG capsule Take 40 mg by mouth 2 (two) times daily.        Marland Kitchen glipiZIDE (GLUCOTROL) 10 MG tablet Take 10 mg by mouth 2 (two) times daily before a meal.      . insulin detemir (LEVEMIR) 100 UNIT/ML injection Inject 50 Units into the skin at bedtime.      Marland Kitchen lisinopril-hydrochlorothiazide (PRINZIDE,ZESTORETIC) 20-12.5 MG per tablet Take 1 tablet by mouth every morning.        . nicotine (NICODERM CQ - DOSED IN MG/24 HOURS) 21 mg/24hr patch Place 1 patch onto the skin daily.      Marland Kitchen OVER THE COUNTER MEDICATION Place 2 drops into both eyes daily as needed. Saline drop for dry eyes       . traZODone (DESYREL) 100 MG tablet Take 100 mg by mouth at bedtime.        Marland Kitchen warfarin (COUMADIN) 5 MG tablet  Take 5 mg by mouth one time only at 6 PM.      . DISCONTD: albuterol (PROVENTIL HFA;VENTOLIN HFA) 108 (90 BASE) MCG/ACT inhaler Inhale 2 puffs into the lungs every 4 (four) hours as needed for wheezing (cough).  1 Inhaler  0  . DISCONTD: glipiZIDE (GLUCOTROL XL) 2.5 MG 24 hr tablet Take 2.5-5 mg by mouth 2 (two) times daily. 5 mg in the morning and 2.5 mg at night      . DISCONTD: insulin detemir (LEVEMIR) 100 UNIT/ML injection Inject 50 Units into the skin at bedtime.  10 mL  12  . DISCONTD: warfarin (COUMADIN) 5 MG tablet Take 1 tablet (5 mg total) by mouth one time only at 6 PM.  30 tablet  0  . DISCONTD: loperamide (IMODIUM) 2 MG capsule Take 2 mg by mouth once.        Marland Kitchen DISCONTD: venlafaxine (EFFEXOR-XR) 75 MG 24 hr capsule Take 75 mg by mouth every evening.          Assessment: 56 yo F on coumadin for aortic thrombus with splenic and renal infarcts admitted with abdominal pain. INR 1.85 today. Home dose 5mg  daily.  Pt only received 2.5 mg yesterday instead of  intended 7.5 mg total Goal of Therapy:  INR 2-3   Plan:   Dose of 7.5mg  today. Daily INR.  Quanell Loughney Poteet 09/26/2011,1:15 PM

## 2011-09-26 NOTE — Progress Notes (Signed)
Patient ID: Kristy Stewart, female   DOB: [birth year], 56 y.o.   MRN: [id number] Subjective: I want to go home.  If I don't have my pain medicine on time the left side pain is so severe I start vomiting.  Objective: Weight change:  No intake or output data in the 24 hours ending 09/26/11 0936 Blood pressure 128/83, pulse 95, temperature 99.5 F (37.5 C), resp. rate 20, height 5\' 7"  (1.702 m), weight 106.1 kg (233 lb 14.5 oz), SpO2 93.00%. Filed Vitals:   09/25/11 1839 09/25/11 2106 09/26/11 0500  BP: 115/66 107/72 128/83  Pulse: 112 93 95  Temp: 99.4 F (37.4 C) 99.2 F (37.3 C) 99.5 F (37.5 C)  Resp: 18 18 20   Height: 5\' 7"  (1.702 m)    Weight: 106 kg (233 lb 11 oz)  106.1 kg (233 lb 14.5 oz)  SpO2: 96% 92% 93%    Physical Exam: General: Anxious, talking to herself, diaphoretic Lungs: Clear to auscultation bilaterally without wheezes or crackles Cardiovascular: Regular rate and rhythm without murmur gallop or rub normal S1 and S2 Abdomen: Nontender, nondistended, soft, bowel sounds positive, no rebound, no ascites, no appreciable mass Extremities: No significant cyanosis, clubbing, or edema bilateral lower extremities  Basic Metabolic Panel:  Lab 09/26/11 4098 09/25/11 1926  NA 134* 133*  K 3.9 3.8  CL 99 98  CO2 22 26  GLUCOSE 277* 132*  BUN 11 10  CREATININE 0.83 0.88  CALCIUM 9.2 9.7  MG -- --  PHOS -- --   Liver Function Tests:  Lab 09/26/11 0606  AST 43*  ALT 18  ALKPHOS 227*  BILITOT 0.2*  PROT 6.8  ALBUMIN 2.8*   CBC:  Lab 09/26/11 0606 09/25/11 1926  WBC 7.8 7.1  NEUTROABS -- --  HGB 12.5 12.7  HCT 36.4 36.3  MCV 86.1 85.8  PLT 536* 579*   CBG:  Lab 09/26/11 0731 09/26/11 0639 09/25/11 2103  GLUCAP 235* 245* 122*   Coagulation:  Lab 09/26/11 0606 09/25/11 2006  LABPROT 21.7* 18.7*  INR 1.85* 1.53*    Studies/Results: Scheduled Meds:   . buPROPion  75 mg Oral BID  . cholestyramine light  4 g Oral Q1200  . lisinopril  20 mg Oral  Daily   And  . hydrochlorothiazide  12.5 mg Oral Daily  . insulin detemir  50 Units Subcutaneous QHS  . loperamide  2 mg Oral Once  . nicotine  21 mg Transdermal Q24H  . pantoprazole  80 mg Oral Q1200  . pneumococcal 23 valent vaccine  0.5 mL Intramuscular Tomorrow-1000  . traZODone  100 mg Oral QHS  . venlafaxine  75 mg Oral QPM  . warfarin  2.5 mg Oral Once  . warfarin  5 mg Oral ONCE-1800  . DISCONTD: cholestyramine  4 g Oral BH-q7a  . DISCONTD: lisinopril-hydrochlorothiazide  1 tablet Oral BH-q7a   Continuous Infusions:  PRN Meds:.albuterol, ALPRAZolam, HYDROcodone-acetaminophen, iohexol  Anti-infectives:  Anti-infectives    None     CT Angio Chest/Abdomen/Pelvis 09/25/11  IMPRESSION:  1. Interval decrease in amount of mural thrombus in the abdominal aorta just cephalad to the origin of the inferior mesenteric  artery. No evidence of interval embolic disease.  2. 13.5 cm cystic transformation of the previously noted central splenic infarct, suggesting interval hemorrhage within the lesion.  There is no evidence of layering blood products, active extravasation, pseudoaneurysm, or perisplenic hematoma.   Assessment/Plan: Principal Problem:  *Splenic cyst Active Problems:  Aortic Thrombus  Splenic infarction  Diabetes mellitus  Anxiety and depression  56 year old female who was recently discharged from the hospital on 09/10/2011, after she was diagnosed with aortic thrombus, splenic and renal infarcts. Patient is currently on anti-coagulation with Coumadin, with INR therapeutic. Today patient went to the primary care office to get more pain medication as the Lortab she was prescribed from the hospital ran out last Wednesday. Patient says that the pain became worse after she stopped taking Lortab.Marland Kitchen Also patient was found to have low-grade temperature of 100.4 in the PCP office she was sent for further evaluation. Patient denies any chest pain, admits to having nausea vomiting  today and admits to having severe left-sided pain with radiation to left arm and left chest wall. She was seen by vascular surgery Dr. Darrick Penna and has an appointment to see him on March 21. Patient denies any chills, no headache no blurred vision.   1.  Left Sided Abdominal Pain - Hemorrhagic splenic cyst (new since 1/15).  Stable on Pain medication.  Still on Warfarin.  Have consulted Dr. Darrick Penna this morning 2/6 for his recommendations.    2.  Aortic Thrombus - decreased in size on CT Angio.  3.  Diabetes - will start diabetic diet, continue detemir, and add sliding scale moderate.  4.  Anxiety - continue home meds and PRN Alprazolam  PATIENT WAS SEEN AND EXAMINED AT BEDSIDE. I AGREE WITH THE ASSESSMENT AN PLAN ABOVE BY PA.  Alton Tremblay [facility] PAGER [phone]    LOS: 1 day   Stephani Police 09/26/2011, 9:36 AM [phone]

## 2011-09-26 NOTE — Progress Notes (Signed)
Pt CT scan reviewed findings d/w Dr Violeta Gelinas of General Surgery as well as primary medical service.  Pt has had significant resolution of her thrombus burden.  She has an evolving splenic infarct and cyst formation.  I do not believe she has active bleeding.  The best option for her at this point is continued pain control for her spleen which should improve with time.  Continue coumadin as she has not had any complications from this and her thrombus load is improving.  She has follow-up scheduled with me in the next few weeks.   Fabienne Bruns, MD [facility] Office: [phone] Pager: [phone]

## 2011-09-26 NOTE — Discharge Summary (Signed)
Patient ID: Kristy Stewart MRN: [id number] DOB/AGE: [birth year] 56 y.o.  Admit date: 09/25/2011 Discharge date: 09/26/2011  Primary Care Physician:  Herb Grays, MD, MD  Discharge Diagnoses:   Present on Admisssion:  Principal Problem:  *Splenic cyst causing left sided abdominal pain. Active Problems:  Aortic Thrombus  Splenic infarction  Diabetes mellitus  Anxiety and depression   Medication List  As of 09/26/2011  2:42 PM   STOP taking these medications         glipiZIDE 2.5 MG 24 hr tablet      loperamide 2 MG capsule         TAKE these medications         albuterol 108 (90 BASE) MCG/ACT inhaler   Commonly known as: PROVENTIL HFA;VENTOLIN HFA   Inhale 2 puffs into the lungs every 4 (four) hours as needed. For shortness of breath      ALPRAZolam 0.5 MG tablet   Commonly known as: XANAX   Take 0.25 mg by mouth 2 (two) times daily as needed. For anxiety      buPROPion 75 MG tablet   Commonly known as: WELLBUTRIN   Take 75 mg by mouth 2 (two) times daily.      celecoxib 200 MG capsule   Commonly known as: CELEBREX   Take 200 mg by mouth 2 (two) times daily.      cholestyramine 4 GM/DOSE powder   Commonly known as: QUESTRAN   Take 4 g by mouth every morning.      esomeprazole 40 MG capsule   Commonly known as: NEXIUM   Take 40 mg by mouth 2 (two) times daily.      glipiZIDE 10 MG tablet   Commonly known as: GLUCOTROL   Take 10 mg by mouth 2 (two) times daily before a meal.      HYDROcodone-acetaminophen 5-325 MG per tablet   Commonly known as: NORCO   Take 1-2 tablets by mouth every 4 (four) hours as needed.      insulin detemir 100 UNIT/ML injection   Commonly known as: LEVEMIR   Inject 50 Units into the skin at bedtime.      lisinopril-hydrochlorothiazide 20-12.5 MG per tablet   Commonly known as: PRINZIDE,ZESTORETIC   Take 1 tablet by mouth every morning.      nicotine 21 mg/24hr patch   Commonly known as: NICODERM CQ - dosed in mg/24 hours   Place  1 patch onto the skin daily.      OVER THE COUNTER MEDICATION   Place 2 drops into both eyes daily as needed. Saline drop for dry eyes      traZODone 100 MG tablet   Commonly known as: DESYREL   Take 100 mg by mouth at bedtime.      venlafaxine 75 MG 24 hr capsule   Commonly known as: EFFEXOR-XR   Take 1 capsule (75 mg total) by mouth every evening.      warfarin 5 MG tablet   Commonly known as: COUMADIN   Take 5 mg by mouth one time only at 6 PM.            Consults:  Vascular Surgery:  Dr. Fabienne Bruns.  Brief H and P: From the admission note:  56 year old female who was recently discharged from the hospital on 09/10/2011, after she was diagnosed with aortic thrombus, splenic and renal infarcts. Patient is currently on anti-coagulation with Coumadin, with INR therapeutic. Today patient went to the primary care office to  get more pain medication as the Lortab she was prescribed from the hospital ran out last Wednesday. Patient says that the pain became worse after she stopped taking Lortab.Marland Kitchen Also patient was found to have low-grade temperature of 100.4 in the PCP office she was sent for further evaluation. Patient denies any chest pain, admits to having nausea vomiting today and admits to having severe left-sided pain with radiation to left arm and left chest wall. She was seen by vascular surgery Dr. Darrick Penna and has an appointment to see him on March 21. Patient denies any chills, no headache no blurred vision.  1. Left Sided Abdominal Pain - Evolution of splenic infarct into hemorrhagic splenic cyst.  Dr. Darrick Penna evaluated the patient's CT Angio and discussed it with Dr. Janee Morn of general surgery.  His impression is as follows: " Pt CT scan reviewed findings d/w Dr Violeta Gelinas of General Surgery as well as primary medical service. Pt has had significant resolution of her thrombus burden. She has an evolving splenic infarct and cyst formation. I do not believe she has active  bleeding. The best option for her at this point is continued pain control for her spleen which should improve with time. Continue coumadin as she has not had any complications from this and her thrombus load is improving. She has follow-up scheduled with me in the next few weeks."   Consequently the patient will be discharged today on coumadin with a prescription for 30 tabs of Norco 5-325.  She is to follow with her PCP for additional pain medication and INR checks.  INR today is 1.85.    2. Aortic Thrombus - decreased in size on CT Angio.   Will follow with Dr. Darrick Penna on 3/21. 3. Diabetes -  diabetic diet, continue detemir, follow with PCP.  4. Anxiety - continue home meds and PRN Alprazolam   Physical Exam:  General: Anxious, talking to herself, diaphoretic  Lungs: Clear to auscultation bilaterally without wheezes or crackles  Cardiovascular: Regular rate and rhythm without murmur gallop or rub normal S1 and S2  Abdomen: Nontender, nondistended, soft, bowel sounds positive, no rebound, no ascites, no appreciable mass  Extremities: No significant cyanosis, clubbing, or edema bilateral lower extremities   Filed Vitals:   09/25/11 1839 09/25/11 2106 09/26/11 0500  BP: 115/66 107/72 128/83  Pulse: 112 93 95  Temp: 99.4 F (37.4 C) 99.2 F (37.3 C) 99.5 F (37.5 C)  Resp: 18 18 20   Height: 5\' 7"  (1.702 m)    Weight: 106 kg (233 lb 11 oz)  106.1 kg (233 lb 14.5 oz)  SpO2: 96% 92% 93%     Basic Metabolic Panel:  Lab 09/26/11 5621 09/25/11 1926  NA 134* 133*  K 3.9 3.8  CL 99 98  CO2 22 26  GLUCOSE 277* 132*  BUN 11 10  CREATININE 0.83 0.88  CALCIUM 9.2 9.7  MG -- --  PHOS -- --   Liver Function Tests:  Lab 09/26/11 0606  AST 43*  ALT 18  ALKPHOS 227*  BILITOT 0.2*  PROT 6.8  ALBUMIN 2.8*   CBC:  Lab 09/26/11 0606 09/25/11 1926  WBC 7.8 7.1  NEUTROABS -- --  HGB 12.5 12.7  HCT 36.4 36.3  MCV 86.1 85.8  PLT 536* 579*   CBG:  Lab 09/26/11 1213 09/26/11  0731 09/26/11 0639 09/25/11 2103  GLUCAP 268* 235* 245* 122*   Coagulation:  Lab 09/26/11 0606 09/25/11 2006  LABPROT 21.7* 18.7*  INR 1.85* 1.53*  Significant Diagnostic Studies:   Ct Angio Abd/pel W/ And/or W/o  09/26/2011  *RADIOLOGY REPORT*  Clinical Data:  Chest, left arm, and back pain.  CT ANGIOGRAPHY CHEST, ABDOMEN AND PELVIS   CTA CHEST   IMPRESSION:  1.  Interval decrease in amount of mural thrombus in the abdominal aorta just cephalad to the origin of the inferior mesenteric artery.  No evidence of interval embolic disease. 2.  13.5 cm cystic transformation of the previously noted central splenic infarct, suggesting interval hemorrhage within the lesion. There is no evidence of layering blood products, active extravasation, pseudoaneurysm, or perisplenic hematoma.  Original Report Authenticated By: Osa Craver, M.D.   Ct Angio Abd/pel W/ And/or W/o  09/04/2011  *RADIOLOGY REPORT*  Clinical Data:  Severe abdominal pain, nausea, vomiting, diarrhea  CT ANGIOGRAPHY ABDOMEN AND PELVIS    IMPRESSION: Heterogeneous enhancing pattern of the spleen which persist on the portal venous phase delayed imaging suspicious for multi focal splenic infarcts.  Wedge shaped defect right kidney lower pole suspicious for a renal infarct.  Finding suggest a possible embolic phenomenon.  Small nonocclusive noncalcified plaque formation or thrombus in the lower thoracic aorta and to a more pronounced degree in the infrarenal abdominal aorta.  Slight narrowing of the celiac origin however the celiac, SMA and IMA appear patent.  Single renal arteries appear patent as well.  No CT evidence of bowel obstruction, bowel wall thickening, pneumatosis, or portal venous gas      Disposition and Follow-up: Stable for discharge to home this afternoon.  Discharge Orders    Future Appointments: Provider: Department: Dept Phone: Center:   11/08/2011 10:00 AM [facility] [phone] [facility]    11/08/2011 12:30 PM Sherren Kerns, MD [facility] [phone] [facility]     Future Orders Please Complete By Expires   Diet - low sodium heart healthy      Increase activity slowly        Follow-up Information    Follow up with Sherren Kerns, MD on 11/08/2011.   Contact information:   [address] [place] [address] [phone]       Follow up with Herb Grays, MD. (This week regarding coumadin check and hospital follow up )    Contact information:   [address] [place] [address] [phone]         Patient was seen and examined at bedside. Laboratory studies and imagine studies were discussed with the patient.  I agree with the assessment and plan above by PA. Patient is medically stable for discharge.  Time spent on Discharge:25 min.  SignedStephani Police 09/26/2011, 2:28 PM [phone]

## 2011-09-26 NOTE — Progress Notes (Signed)
Utilization review complete 

## 2011-09-26 NOTE — Plan of Care (Signed)
Problem: Phase I Progression Outcomes Goal: Other Phase I Outcomes/Goals Outcome: Completed/Met Date Met:  09/26/11 PA spoke with vascular and other caregivers, no need to hold pt, will be discharged today.

## 2011-10-10 ENCOUNTER — Other Ambulatory Visit (HOSPITAL_COMMUNITY): Payer: Self-pay | Admitting: Family Medicine

## 2011-10-10 DIAGNOSIS — R6889 Other general symptoms and signs: Secondary | ICD-10-CM

## 2011-10-10 DIAGNOSIS — I7409 Other arterial embolism and thrombosis of abdominal aorta: Secondary | ICD-10-CM

## 2011-10-10 DIAGNOSIS — C801 Malignant (primary) neoplasm, unspecified: Secondary | ICD-10-CM

## 2011-10-18 ENCOUNTER — Encounter (HOSPITAL_COMMUNITY)
Admission: RE | Admit: 2011-10-18 | Discharge: 2011-10-18 | Disposition: A | Discharge status: Home / Self Care | Payer: BC Managed Care – PPO | Source: Ambulatory Visit | Attending: Family Medicine | Admitting: Family Medicine

## 2011-10-18 DIAGNOSIS — C801 Malignant (primary) neoplasm, unspecified: Secondary | ICD-10-CM

## 2011-10-18 DIAGNOSIS — I7409 Other arterial embolism and thrombosis of abdominal aorta: Secondary | ICD-10-CM

## 2011-10-18 DIAGNOSIS — M25519 Pain in unspecified shoulder: Secondary | ICD-10-CM | POA: Insufficient documentation

## 2011-10-18 DIAGNOSIS — R6889 Other general symptoms and signs: Secondary | ICD-10-CM

## 2011-10-18 MED ORDER — TECHNETIUM TC 99M MEDRONATE IV KIT
25.0000 | PACK | Freq: Once | INTRAVENOUS | Status: AC | PRN
  Administered 2011-10-18: 25 via INTRAVENOUS

## 2011-11-01 ENCOUNTER — Encounter: Payer: Self-pay | Admitting: Vascular Surgery

## 2011-11-07 ENCOUNTER — Encounter: Payer: Self-pay | Admitting: Vascular Surgery

## 2011-11-08 ENCOUNTER — Ambulatory Visit
Admission: RE | Admit: 2011-11-08 | Discharge: 2011-11-08 | Disposition: A | Discharge status: Home / Self Care | Payer: BC Managed Care – PPO | Source: Ambulatory Visit | Attending: Vascular Surgery | Admitting: Vascular Surgery

## 2011-11-08 ENCOUNTER — Ambulatory Visit (INDEPENDENT_AMBULATORY_CARE_PROVIDER_SITE_OTHER): Payer: BC Managed Care – PPO | Admitting: Vascular Surgery

## 2011-11-08 ENCOUNTER — Encounter: Payer: Self-pay | Admitting: Vascular Surgery

## 2011-11-08 VITALS — BP 198/45 | HR 59 | Resp 18 | Ht 67.0 in | Wt 226.0 lb

## 2011-11-08 DIAGNOSIS — D735 Infarction of spleen: Secondary | ICD-10-CM

## 2011-11-08 DIAGNOSIS — D7389 Other diseases of spleen: Secondary | ICD-10-CM

## 2011-11-08 DIAGNOSIS — N280 Ischemia and infarction of kidney: Secondary | ICD-10-CM | POA: Insufficient documentation

## 2011-11-08 DIAGNOSIS — K551 Chronic vascular disorders of intestine: Secondary | ICD-10-CM

## 2011-11-08 DIAGNOSIS — N2889 Other specified disorders of kidney and ureter: Secondary | ICD-10-CM

## 2011-11-08 MED ORDER — IOHEXOL 350 MG/ML SOLN
100.0000 mL | Freq: Once | INTRAVENOUS | Status: AC | PRN
  Administered 2011-11-08: 100 mL via INTRAVENOUS

## 2011-11-08 NOTE — Progress Notes (Signed)
History of Present Illness: Patient is a 56 y.o. year old female who presents for followup of abdominal pain. Other medical problems include diabetes, smoking, arthritis. In January, the patient has had flu like upper respiratory symptoms for 7-10 days.  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 She was managed conservatively with anticoagulation. The thrombus load is now completely resolved. She returns for further followup today with an additional CT scan. She also continues to complain of abdominal pain. She will require narcotic pain medication 4-5 times per day to control this. She states the pain is slightly better than it was in January but not really much better Denies claudication. No prior similar events. No history of hypercoag state. Smokes 1/2 ppd for about 40 yrs but is trying to quit  Past Medical History   Diagnosis  Date   .  Diabetes mellitus    .  Arthritis    .  Migraines    History reviewed. No pertinent past surgical history.  Social History  History   Substance Use Topics   .  Smoking status:  Never Smoker   .  Smokeless tobacco:  Never Used   .  Alcohol Use:  No   Family History  History reviewed. No pertinent family history.  Allergies  Allergies   Allergen  Reactions   .  Celebrex (Celecoxib)  Anaphylaxis   .  Codeine  Other (See Comments)     Violently ill   .  Erythromycin  Nausea And Vomiting   .  Lactose Intolerance (Gi)  Other (See Comments)     GI upset   .  Penicillins  Nausea Only    Current Outpatient Prescriptions on File Prior to Visit  Medication Sig Dispense Refill  . albuterol (PROVENTIL HFA;VENTOLIN HFA) 108 (90 BASE) MCG/ACT inhaler Inhale 2 puffs into the lungs every 4 (four) hours as needed. For shortness of breath      . ALPRAZolam (XANAX) 0.5 MG tablet Take  0.25 mg by mouth 2 (two) times daily as needed. For anxiety      . buPROPion (WELLBUTRIN) 75 MG tablet Take 75 mg by mouth 2 (two) times daily.        . cholestyramine (QUESTRAN) 4 GM/DOSE powder Take 4 g by mouth every morning.        Marland Kitchen glipiZIDE (GLUCOTROL) 10 MG tablet Take 10 mg by mouth 2 (two) times daily before a meal.      . lisinopril-hydrochlorothiazide (PRINZIDE,ZESTORETIC) 20-12.5 MG per tablet Take 1 tablet by mouth every morning.        Marland Kitchen OVER THE COUNTER MEDICATION Place 2 drops into both eyes daily as needed. Saline drop for dry eyes       . traZODone (DESYREL) 100 MG tablet Take 100 mg by mouth at bedtime.        Marland Kitchen venlafaxine (EFFEXOR-XR) 75 MG 24 hr capsule Take 1 capsule (75 mg total) by mouth every evening.  1 capsule  0  . warfarin (COUMADIN) 5 MG tablet Take 5 mg by mouth one time only at 6 PM. PATIENT TAKING 2.5 MG M,T, AND S  5MG  ON Sunday, WED AND FRI      . celecoxib (CELEBREX) 200 MG capsule Take 200 mg by mouth 2 (two) times daily.      Marland Kitchen  esomeprazole (NEXIUM) 40 MG capsule Take 40 mg by mouth 2 (two) times daily.        . insulin detemir (LEVEMIR) 100 UNIT/ML injection Inject 50 Units into the skin at bedtime.      . nicotine (NICODERM CQ - DOSED IN MG/24 HOURS) 21 mg/24hr patch Place 1 patch onto the skin daily.       No current facility-administered medications on file prior to visit.   ROS:  General: No weight loss, Fever, chills  HEENT: No recent headaches, no nasal bleeding, no visual changes, + sore throat  Neurologic: No dizziness, blackouts, seizures. No recent symptoms of stroke or mini- stroke. No recent episodes of slurred speech, or temporary blindness.  Cardiac: No recent episodes of chest pain/pressure, no shortness of breath at rest. No shortness of breath with exertion. Denies history of atrial fibrillation or irregular heartbeat  Vascular: No history of rest pain in feet. No history of claudication. No history of non-healing ulcer, No history of DVT   Pulmonary: No home oxygen, no productive cough, no hemoptysis, No asthma or wheezing  Musculoskeletal: [x ] Arthritis, [ ]  Low back pain, [x ] Joint pain  Hematologic:No history of hypercoagulable state. No history of easy bleeding. No history of anemia  Gastrointestinal: No hematochezia or melena, No gastroesophageal reflux, no trouble swallowing  Urinary: [ ]  chronic Kidney disease, [ ]  on HD - [ ]  MWF or [ ]  TTHS, [ ]  Burning with urination, [ ]  Frequent urination, [ ]  Difficulty urinating;  Skin: No rashes  Psychological: No history of anxiety, No history of depression  Physical Examination   Filed Vitals:   11/08/11 1244  BP: 198/45  Pulse: 59  Resp: 18  Height: 5\' 7"  (1.702 m)  Weight: 226 lb (102.513 kg)  General: Alert and oriented, no acute distress  HEENT: Normal  Neck: No bruit or JVD  Pulmonary: Clear to auscultation bilaterally  Cardiac: Regular Rate and Rhythm without murmur, tachycardia  Abdomen: Soft, mild left upper quadrant tenderness, non-distended, no mass, obese  Skin: No rash, toes dusky bilat. Temp symmetric  Extremity Pulses: 2+ radial, brachial, femoral pulse bilaterally, right foot DP/PT doppler, left foot PT doppler  Musculoskeletal: No deformity or edema  Neurologic: Upper and lower extremity motor 5/5 and symmetric, sensation feet intact and symmetric   DATA: CTA Abd/Pelvis: Thrombus lower aorta resolved, small infarct kidney right inferior, infarct spleen with large cystic component, patent SMA, patent celiac. Thrombus previously in celiac resolved.   reviewed these images personally   ASSESSMENT: Atheroemboli of unknown etiology differential includes ruptured plaque, thrombus from dehydration, cardiac source.  Thrombus load resolved at this point.  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  Would continue coumadin for 6 months to a year but she can come off this if splenectomy or other intervention required.  Told her it  would be ok to discuss Chantix with Dr Yehuda Budd.  The pt will be scheduled with Dr Marca Ancona to evaluate spleen for intervention.   Fabienne Bruns, MD  [facility]  Office: [phone]  Pager: [phone]

## 2011-11-19 ENCOUNTER — Telehealth (INDEPENDENT_AMBULATORY_CARE_PROVIDER_SITE_OTHER): Payer: Self-pay | Admitting: General Surgery

## 2011-11-19 NOTE — Telephone Encounter (Signed)
Pt calling to see if possible to move up appt to Monday (from Friday.) She is having more pain.  She understands Monday's schedule is full, but would like to be called if a cancellation opens up.

## 2011-11-21 ENCOUNTER — Telehealth (INDEPENDENT_AMBULATORY_CARE_PROVIDER_SITE_OTHER): Payer: Self-pay | Admitting: General Surgery

## 2011-11-21 NOTE — Telephone Encounter (Signed)
Tiquila calling back to ask for MD-to-MD conference.  Dr. Gerrit Friends spoke with Dr. Yehuda Budd, recommended CT scan now with updated labs.

## 2011-11-21 NOTE — Telephone Encounter (Signed)
Tequila at the Dr. Alda Berthold office calling about pt---deteriorating hemoglobin and PICA now.  Has appt next week with Dr. Luisa Hart, but is being sent to ER from Dr. Alda Berthold office today.

## 2011-11-22 ENCOUNTER — Telehealth (INDEPENDENT_AMBULATORY_CARE_PROVIDER_SITE_OTHER): Payer: Self-pay

## 2011-11-22 NOTE — Telephone Encounter (Signed)
The patient's coworker Dr Malvin Johns called from the surgical center to try to help get her in sooner.  I got the patient on the phone and she said her hemoglobin is dropping and she needs her spleen out.  Dr Yehuda Budd tried to get her in yesterday but spoke to Dr Gerrit Friends.  He advised she needs to go to the ER and get a ct scan.  She did not go due to cost and not wanting to get a random dr to do her surgery.  I reminded her she needs to go to the Er.  She said she tried to get Dr Yehuda Budd to order a ct but she did not want to take care of it.  The patient has had 3 ct scans so far.  I looked for a sooner appt with any md and could not find any so I left her appt on 4/12 with dr Luisa Hart.  I told her she needs to go to the Er or call dr Yehuda Budd again.

## 2011-11-30 ENCOUNTER — Ambulatory Visit (INDEPENDENT_AMBULATORY_CARE_PROVIDER_SITE_OTHER): Payer: BC Managed Care – PPO | Admitting: Surgery

## 2011-11-30 ENCOUNTER — Encounter (INDEPENDENT_AMBULATORY_CARE_PROVIDER_SITE_OTHER): Payer: Self-pay | Admitting: Surgery

## 2011-11-30 VITALS — BP 120/67 | HR 118 | Temp 98.6°F | Ht 67.0 in | Wt 222.8 lb

## 2011-11-30 DIAGNOSIS — D7389 Other diseases of spleen: Secondary | ICD-10-CM

## 2011-11-30 DIAGNOSIS — D734 Cyst of spleen: Secondary | ICD-10-CM

## 2011-11-30 MED ORDER — HYDROCODONE-ACETAMINOPHEN 5-325 MG PO TABS: 1.0000 | ORAL_TABLET | ORAL | Status: DC | PRN

## 2011-11-30 NOTE — Progress Notes (Signed)
Patient ID: Kristy Stewart, female   DOB: 04/04/1956, 55 y.o.   MRN: [id number]  Chief Complaint  Patient presents with  . Pre-op Exam    eval of spleenectomy    HPI Kristy Stewart is a 55 y.o. female.   HPIPatient sent at the request of Dr. fields due to her history of splenic infarction and subsequent splenic cyst. 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 She is on Coumadin now. 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 This is quite large. The spleen is at least 3-4 times its normal size on CT. It is causing significant left upper quadrant pain back pain and shoulder pain. She requires chronic narcotics for this. An attempt to follow this nonoperatively has been unsuccessful the last 3 months. The cyst is not resolving by CT and it is impacting her quality-of-life. She feels tired today. She has had a chronic cough.  Past Medical History  Diagnosis Date  . Diabetes mellitus   . Arthritis   . Allergy   . GERD (gastroesophageal reflux disease)   . Anxiety   . Depression   . Hypertension   . 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 History of blood clots 08/2011    spleen and kidney; "from ulcer on my aorta"  . Bronchitis 08/2011  . H/O hiatal hernia   . Migraines     "gone since starting BP meds 2007"  . Spleen injury 08/2011    "from blood clot from ulcer on aorta"    Past Surgical History  Procedure Date  . Knee arthroscopy 04/2009    left    Family History  Problem Relation Age of Onset  . Heart disease Father   . Cancer Father     prostate  . Cancer Sister     ovarian    Social History History  Substance Use Topics  . Smoking status: Current Everyday Smoker -- 0.2 packs/day for 37 years    Types: Cigars  . Smokeless tobacco: Never Used   Comment: 8cigars a day  . Alcohol Use: No    Allergies  Allergen  Reactions  . Codeine Other (See Comments)    Violently ill; reports having taken Vicodin previously without problems  . Erythromycin Nausea And Vomiting  . Lactose Intolerance (Gi) Other (See Comments)    GI upset  . Penicillins Nausea Only    Current Outpatient Prescriptions  Medication Sig Dispense Refill  . albuterol (PROVENTIL HFA;VENTOLIN HFA) 108 (90 BASE) MCG/ACT inhaler Inhale 2 puffs into the lungs every 4 (four) hours as needed. For shortness of breath      . ALPRAZolam (XANAX) 0.5 MG tablet Take 0.25 mg by mouth 2 (two) times daily as needed. For anxiety      . buPROPion (WELLBUTRIN) 75 MG tablet Take 75 mg by mouth 2 (two) times daily.        . cholestyramine (QUESTRAN) 4 GM/DOSE powder Take 4 g by mouth every morning.        . glipiZIDE (GLUCOTROL) 10 MG tablet Take 10 mg by mouth 2 (two) times daily before a meal.      . HYDROcodone-acetaminophen (NORCO) 5-325 MG per tablet Take 1 tablet by mouth every 4 (four) hours as needed for pain.  30 tablet  0  . lisinopril-hydrochlorothiazide (PRINZIDE,ZESTORETIC) 20-12.5 MG per tablet   Take 1 tablet by mouth every morning.        . OVER THE COUNTER MEDICATION Place 2 drops into both eyes daily as needed. Saline drop for dry eyes       . pantoprazole (PROTONIX) 40 MG tablet Take 40 mg by mouth 2 (two) times daily.      . sitaGLIPtin (JANUVIA) 100 MG tablet Take 100 mg by mouth daily.      . traZODone (DESYREL) 100 MG tablet Take 100 mg by mouth at bedtime.        . venlafaxine (EFFEXOR-XR) 75 MG 24 hr capsule Take 1 capsule (75 mg total) by mouth every evening.  1 capsule  0  . warfarin (COUMADIN) 5 MG tablet Take 5 mg by mouth one time only at 6 PM. PATIENT TAKING 2.5 MG M,T, AND S  5MG ON Sunday, WED AND FRI        Review of Systems Review of Systems  Constitutional: Positive for activity change, appetite change and fatigue. Negative for fever.  HENT: Negative.   Eyes: Negative.   Respiratory: Positive for cough.     Cardiovascular: Negative.   Gastrointestinal: Positive for vomiting, abdominal pain and diarrhea.  Genitourinary: Negative.   Musculoskeletal: Negative.   Skin: Negative.   Hematological: Bruises/bleeds easily.  Psychiatric/Behavioral: Positive for dysphoric mood.    Blood pressure 120/67, pulse 118, temperature 98.6 F (37 C), temperature source Temporal, height 5' 7" (1.702 m), weight 222 lb 12.8 oz (101.061 kg), SpO2 97.00%.  Physical Exam Physical Exam  Constitutional: She is oriented to person, place, and time. She appears well-developed and well-nourished.  HENT:  Head: Normocephalic and atraumatic.  Eyes: EOM are normal. Pupils are equal, round, and reactive to light.  Neck: Normal range of motion. Neck supple.  Cardiovascular: Normal rate and regular rhythm.   Pulmonary/Chest: Effort normal and breath sounds normal. No respiratory distress.  Abdominal: Soft. Bowel sounds are normal. There is tenderness.    Neurological: She is alert and oriented to person, place, and time. GCS eye subscore is 4. GCS verbal subscore is 5. GCS motor subscore is 6.  Skin: Skin is warm, dry and intact.  Psychiatric: She exhibits a depressed mood.    Data Reviewed Dr Fields notes,  CT-scan of the abdomen  And blood work.  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 Diabetes mellitus   . Arthritis   . Allergy   . GERD (gastroesophageal reflux disease)   . Anxiety   . Depression   . Hypertension   . 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 History of blood clots 08/2011    spleen and kidney; "from ulcer on my aorta"  . Bronchitis 08/2011  . H/O hiatal hernia   . Migraines     "gone since starting BP meds 2007"  . Spleen injury 08/2011    "from blood clot from ulcer on aorta"       Plan    Options include observation, aspiration were splenectomy. She has failed medical management at this point in time therefore I do not believe observation will make his condition better. Aspiration has high risk of bleeding and may not resolve the problem completely. Splenectomy is probably her best choice at this point. Spleen is quite large and therefore   laparoscopic approach I feel would be difficult to do a potential dangerous. Open splenectomy is recommended. Risks include bleeding, infection, postsplenectomy sepsis, pancreas injury, intra-bowel abscess, wound complication, pulmonary consultation, cardiovascular event, blood clots, and the need for further surgery. She elected she was splenectomy. Questions were answered. I will write for her vaccinations to be given preoperatively.       Josph Norfleet A. 11/30/2011, 11:09 AM    

## 2011-11-30 NOTE — Patient Instructions (Signed)
Open Splenectomy An open splenectomy is surgery to remove your spleen.The spleen is a small organ. It is located on the left side of your abdomen, just below your lung. The spleen has several jobs. It helps fight infection by acting like a giant lymph node. The spleen mayneed to be taken out if it is damaged in a crash or other trauma.It may also need to be taken out if you have certain diseases. Idiopathic thrombocytopenia purpura (ITP) is one such disease. This disease causes your spleen to destroy too many blood cells that are needed for clotting. The procedure is called "open" because your surgeon will open your abdomen with a cut (incision). LET YOUR CAREGIVER KNOW ABOUT:  Any allergies.   All medicines you are taking, including:   Herbs, eyedrops, over-the-counter medicines, and creams.   Blood thinners (anticoagulants), aspirin, or other drugs that could affect blood clotting.   Use of steroids (by mouth or as creams).   Previous problems with anesthesia.   Possibility of pregnancy, if this applies.   Any history of blood clots.   Any history of bleeding or other blood problems.   Previous surgery.   Other health problems.  RISKS AND COMPLICATIONS  Bleeding.   Damage to other organs near the spleen, especially the large intestine, stomach, or pancreas.   Infection. This could occur:   At the incision.   Where the spleen was located, especially if there was damage to the nearby organs.   In the lungs. This is called pneumonia.   In your whole body. This is called overwhelming post-splenectomy infection (OPSI). It occurs only in 1 out of 10,000 adults who have had their spleen removed.   A blood clot that forms somewhere away from the surgery, such as in the legs, arms, or other big veins. Blood clots may rarely travel to the lung.   A hernia. This occurs when the incision does not heal correctly, causing a bulge near the incision.  BEFORE THE PROCEDURE  A  medical evaluation will be done. This may include:   A physical exam.   Tests to make sure you are healthy enough for the procedure. These may include blood tests, tests on your heart, and X-rays.   Talking with the person who will be in charge of the anesthesia during the procedure. Ask what you can expect.   If you smoke, quit a few weeks before the procedure.   A week before the procedure, stop taking drugs that can cause bleeding during and after your procedure.   This includes aspirin and pain medicines called NSAIDs (nonsteroidal anti-inflammatory drugs), such as ibuprofen and naproxen. Also stop taking vitamin E.   If you take any other prescription blood thinners, ask your surgeon when you should stop taking them.   You may be given vaccinations to help prevent infections. This may be needed because not having a spleen makes certain infections more dangerous.   Sometimes, blood or platelet transfusions may be needed.   The night before your procedure, do not eat or drink anything after midnight.   Ask your caregiver about changing or stopping your regular medicines.   Ask your caregiver if you need to arrive early before the procedure. You might need another procedure before your splenectomy. This procedure is done to clot off the blood vessels that carry blood to your spleen.  PROCEDURE An open splenectomy can take about 2 hours or more, depending on your body.  You will be given medicine that  makes you sleep (general anesthetic).   Once you are asleep, the surgeon will make an incision. This may be done in the middle of your abdomen or under your rib cage.   The surgeon will find the spleen. The surgeon will also look for extra spleen tissue in the area, which some people have.   The spleen and any extra spleen tissue that may be found will be taken out.   Your surgeon may decide to leave a drain. This is a small tube that comes out of the skin, near the incision.    The surgeon will close the abdomen.   A bandage (dressing) will be put over the incision.  AFTER THE PROCEDURE  You will stay in a recovery area until the anesthesia has worn off.Your blood pressure and pulse will be monitored. Then you will be taken to your room.   Pain is normal after a splenectomy. You will be given pain medicine. Be sure to tell you caregiver if the pain gets worse or the pain medicine is not working.   You may be asked to get up and start walking within a day.This helps your intestines start working again. It also helps keep blood clots from forming in your legs.   Most people stay in the hospital for several days after this procedure. Then you can go home to continue getting better. Most people who have an open splenectomy fully recover in 4 to 6 weeks.  Document Released: 12/01/2010 Document Revised: 07/26/2011 Document Reviewed: 12/01/2010 [facility] Patient Information 2012 [place], [place].

## 2011-12-07 ENCOUNTER — Telehealth (INDEPENDENT_AMBULATORY_CARE_PROVIDER_SITE_OTHER): Payer: Self-pay | Admitting: General Surgery

## 2011-12-07 NOTE — Telephone Encounter (Signed)
Per VO Dr. Luisa Hart, called Hydrocodone 5/325 mg, # 30, 1 po Q 4-6 H prn pain, NO refill to [facility].  Pt updated that refill was approved by MD and called in.

## 2011-12-07 NOTE — Telephone Encounter (Signed)
Pt calling for refill of pain meds; will run out over week-end.  Surgery next week for spleenectomy.  She is taking Hydrocodone 5/325 mg.  Can we call in refill, please?

## 2011-12-12 ENCOUNTER — Encounter (HOSPITAL_COMMUNITY): Payer: Self-pay | Admitting: Pharmacy Technician

## 2011-12-12 ENCOUNTER — Encounter (HOSPITAL_COMMUNITY): Payer: Self-pay

## 2011-12-12 ENCOUNTER — Encounter (HOSPITAL_COMMUNITY)
Admission: RE | Admit: 2011-12-12 | Discharge: 2011-12-12 | Disposition: A | Discharge status: Home / Self Care | Payer: BC Managed Care – PPO | Source: Ambulatory Visit | Attending: Surgery | Admitting: Surgery

## 2011-12-12 ENCOUNTER — Telehealth (INDEPENDENT_AMBULATORY_CARE_PROVIDER_SITE_OTHER): Payer: Self-pay

## 2011-12-12 ENCOUNTER — Encounter (INDEPENDENT_AMBULATORY_CARE_PROVIDER_SITE_OTHER): Payer: Self-pay

## 2011-12-12 ENCOUNTER — Other Ambulatory Visit (INDEPENDENT_AMBULATORY_CARE_PROVIDER_SITE_OTHER): Payer: Self-pay

## 2011-12-12 DIAGNOSIS — D734 Cyst of spleen: Secondary | ICD-10-CM

## 2011-12-12 HISTORY — DX: Cyst of spleen: D73.4

## 2011-12-12 HISTORY — DX: Embolism and thrombosis of unspecified parts of aorta: I74.10

## 2011-12-12 LAB — COMPREHENSIVE METABOLIC PANEL
BUN: 16 mg/dL (ref 6–23)
CO2: 25 mEq/L (ref 19–32)
Calcium: 9.5 mg/dL (ref 8.4–10.5)
GFR calc Af Amer: 89 mL/min — ABNORMAL LOW (ref >90)
GFR calc non Af Amer: 77 mL/min — ABNORMAL LOW (ref >90)
Glucose, Bld: 104 mg/dL — ABNORMAL HIGH (ref 70–99)
Total Protein: 7.9 g/dL (ref 6.0–8.3)

## 2011-12-12 LAB — CBC
HCT: 34.1 % — ABNORMAL LOW (ref 36.0–46.0)
Hemoglobin: 11.3 g/dL — ABNORMAL LOW (ref 12.0–15.0)
MCH: 26.6 pg (ref 26.0–34.0)
MCHC: 33.1 g/dL (ref 30.0–36.0)
MCV: 80.2 fL (ref 78.0–100.0)
RBC: 4.25 MIL/uL (ref 3.87–5.11)

## 2011-12-12 LAB — DIFFERENTIAL
Basophils Absolute: 0 10*3/uL (ref 0.0–0.1)
Eosinophils Absolute: 0.1 10*3/uL (ref 0.0–0.7)
Eosinophils Relative: 1 % (ref 0–5)
Lymphocytes Relative: 21 % (ref 12–46)
Lymphs Abs: 2.5 10*3/uL (ref 0.7–4.0)
Neutrophils Relative %: 71 % (ref 43–77)

## 2011-12-12 LAB — PROTIME-INR: Prothrombin Time: 24.4 seconds — ABNORMAL HIGH (ref 11.6–15.2)

## 2011-12-12 LAB — SURGICAL PCR SCREEN
MRSA, PCR: NEGATIVE
Staphylococcus aureus: NEGATIVE

## 2011-12-12 MED ORDER — HYDROCODONE-ACETAMINOPHEN 5-325 MG PO TABS: 1.0000 | ORAL_TABLET | ORAL | Status: DC | PRN

## 2011-12-12 NOTE — Pre-Procedure Instructions (Signed)
20 Kristy Stewart  12/12/2011   Your procedure is scheduled on:  Wednesday, May 1   Report to [facility] at 0800 AM.  Call this number if you have problems the morning of surgery: [phone]   Remember:   Do not eat food:After Midnight.  May have clear liquids: up to 4 Hours before arrival.( 4:00 am )  Clear liquids include soda, tea, black coffee, apple or grape juice, broth.  Take these medicines the morning of surgery with A SIP OF WATER: Wellbutrin,Hydrocodone,Xanax, Pantoprazole   Do not wear jewelry, make-up or nail polish.  Do not wear lotions, powders, or perfumes. You may wear deodorant.  Do not shave 48 hours prior to surgery.  Do not bring valuables to the hospital.  Contacts, dentures or bridgework may not be worn into surgery.  Leave suitcase in the car. After surgery it may be brought to your room.  For patients admitted to the hospital, checkout time is 11:00 AM the day of discharge.   Patients discharged the day of surgery will not be allowed to drive home.  Name and phone number of your driver: *n/a**  Special Instructions: CHG Shower Use Special Wash: 1/2 bottle night before surgery and 1/2 bottle morning of surgery.   Please read over the following fact sheets that you were given: Pain Booklet, Coughing and Deep Breathing, MRSA Information and Surgical Site Infection Prevention

## 2011-12-12 NOTE — Telephone Encounter (Signed)
Patient called in this morning requesting a refill on her pain medication Hydrocodone 5/325mg , #30.  Patient denies fever, she complains of nausea due to pain and states she has difficulty with breathing because of her discomfort.  Return to work note requested from patient to be faxed to [employer] [phone].  Dr. Luisa Hart paged. Refill on Hydrocodone 5/325mg  #30 refill authorized and note to excuse patient from work effective 12/12/11.     Call to patient -- Ms. Dalal advised that Dr. Luisa Hart has approved 1 refill on pain medication but, it must last until her surgery on 12/19/2011, patient advised that if she's requiring more pain medication due increase of pain, she need's further assessment in the Emergency Department due to the risk of splenic rupture that could potentially be fatal.  Again, I stressed to Ms. Caruthers that if her pain increases, fever or symptoms increase she need's to call 911 or report to the nearest Emergency Room Immediately due to the risk of splenic rupture and the life threatening, potentially fatal situation this can become and how important it is to be aware of any changes or increase in her symptoms.  Hydrocodone 5/325mg , #30, 0 refill- [phone].

## 2011-12-12 NOTE — Telephone Encounter (Signed)
Original Telephone note not in system/Kristy Stewart, CMA

## 2011-12-12 NOTE — Telephone Encounter (Signed)
Per Dr. Luisa Hart-- patient need's the 3 vaccinations 2 weeks prior to surgery or 2 week post surgery

## 2011-12-12 NOTE — Telephone Encounter (Signed)
Dr. Merceda Elks has included three vaccines on his orders (Hib, pneumo, and meningitis.)  They do not do vaccines at Short Stay.  Please ask Dr. Luisa Hart to amend his orders for this pt.

## 2011-12-13 ENCOUNTER — Encounter (HOSPITAL_COMMUNITY): Payer: Self-pay | Admitting: Pharmacy Technician

## 2011-12-18 MED ORDER — VANCOMYCIN HCL 1000 MG IV SOLR
1500.0000 mg | INTRAVENOUS | Status: AC
  Administered 2011-12-19: 1500 mg via INTRAVENOUS
  Filled 2011-12-18 (×2): qty 1500

## 2011-12-18 MED ORDER — VANCOMYCIN HCL IN DEXTROSE 1-5 GM/200ML-% IV SOLN: 1000.0000 mg | INTRAVENOUS | Status: DC

## 2011-12-19 ENCOUNTER — Encounter (HOSPITAL_COMMUNITY): Payer: Self-pay | Admitting: Certified Registered"

## 2011-12-19 ENCOUNTER — Inpatient Hospital Stay (HOSPITAL_COMMUNITY)
Admission: RE | Admit: 2011-12-19 | Discharge: 2011-12-25 | DRG: 392 | Disposition: A | Discharge status: Home / Self Care | Payer: BC Managed Care – PPO | Source: Ambulatory Visit | Attending: Surgery | Admitting: Surgery

## 2011-12-19 ENCOUNTER — Ambulatory Visit (HOSPITAL_COMMUNITY): Payer: BC Managed Care – PPO | Admitting: Certified Registered"

## 2011-12-19 ENCOUNTER — Encounter (HOSPITAL_COMMUNITY)
Admission: RE | Disposition: A | Discharge status: Home / Self Care | Payer: Self-pay | Source: Ambulatory Visit | Attending: Surgery

## 2011-12-19 ENCOUNTER — Encounter (HOSPITAL_COMMUNITY): Payer: Self-pay | Admitting: Surgery

## 2011-12-19 DIAGNOSIS — D7389 Other diseases of spleen: Principal | ICD-10-CM | POA: Diagnosis present

## 2011-12-19 DIAGNOSIS — D735 Infarction of spleen: Secondary | ICD-10-CM

## 2011-12-19 DIAGNOSIS — E871 Hypo-osmolality and hyponatremia: Secondary | ICD-10-CM | POA: Diagnosis not present

## 2011-12-19 DIAGNOSIS — E119 Type 2 diabetes mellitus without complications: Secondary | ICD-10-CM | POA: Diagnosis present

## 2011-12-19 DIAGNOSIS — I8290 Acute embolism and thrombosis of unspecified vein: Secondary | ICD-10-CM

## 2011-12-19 DIAGNOSIS — N280 Ischemia and infarction of kidney: Secondary | ICD-10-CM

## 2011-12-19 DIAGNOSIS — K219 Gastro-esophageal reflux disease without esophagitis: Secondary | ICD-10-CM | POA: Diagnosis present

## 2011-12-19 DIAGNOSIS — Z7901 Long term (current) use of anticoagulants: Secondary | ICD-10-CM

## 2011-12-19 DIAGNOSIS — I10 Essential (primary) hypertension: Secondary | ICD-10-CM | POA: Diagnosis present

## 2011-12-19 DIAGNOSIS — F3289 Other specified depressive episodes: Secondary | ICD-10-CM | POA: Diagnosis present

## 2011-12-19 DIAGNOSIS — Z720 Tobacco use: Secondary | ICD-10-CM

## 2011-12-19 DIAGNOSIS — F419 Anxiety disorder, unspecified: Secondary | ICD-10-CM

## 2011-12-19 DIAGNOSIS — G43909 Migraine, unspecified, not intractable, without status migrainosus: Secondary | ICD-10-CM | POA: Diagnosis present

## 2011-12-19 DIAGNOSIS — D734 Cyst of spleen: Secondary | ICD-10-CM

## 2011-12-19 DIAGNOSIS — F411 Generalized anxiety disorder: Secondary | ICD-10-CM | POA: Diagnosis present

## 2011-12-19 DIAGNOSIS — F329 Major depressive disorder, single episode, unspecified: Secondary | ICD-10-CM | POA: Diagnosis present

## 2011-12-19 HISTORY — PX: SPLENECTOMY, TOTAL: SHX788

## 2011-12-19 LAB — GLUCOSE, CAPILLARY
Glucose-Capillary: 178 mg/dL — ABNORMAL HIGH (ref 70–99)
Glucose-Capillary: 212 mg/dL — ABNORMAL HIGH (ref 70–99)
Glucose-Capillary: 295 mg/dL — ABNORMAL HIGH (ref 70–99)

## 2011-12-19 LAB — PROTIME-INR
INR: 1.18 (ref 0.00–1.49)
Prothrombin Time: 15.2 seconds (ref 11.6–15.2)

## 2011-12-19 LAB — CREATININE, SERUM
Creatinine, Ser: 0.87 mg/dL (ref 0.50–1.10)
GFR calc Af Amer: 87 mL/min — ABNORMAL LOW (ref >90)

## 2011-12-19 LAB — POCT I-STAT EG7
Acid-base deficit: 8 mmol/L — ABNORMAL HIGH (ref 0.0–2.0)
O2 Saturation: 66 %
Patient temperature: 35.6
TCO2: 19 mmol/L (ref 0–100)
pCO2, Ven: 36.2 mmHg — ABNORMAL LOW (ref 45.0–50.0)
pO2, Ven: 34 mmHg (ref 30.0–45.0)

## 2011-12-19 LAB — CBC
Platelets: 525 10*3/uL — ABNORMAL HIGH (ref 150–400)
RBC: 3.98 MIL/uL (ref 3.87–5.11)
WBC: 17 10*3/uL — ABNORMAL HIGH (ref 4.0–10.5)

## 2011-12-19 LAB — PREPARE RBC (CROSSMATCH)

## 2011-12-19 SURGERY — SPLENECTOMY
Anesthesia: General | Site: Abdomen | Wound class: Dirty or Infected

## 2011-12-19 MED ORDER — ONDANSETRON HCL 4 MG PO TABS: 4.0000 mg | ORAL_TABLET | Freq: Four times a day (QID) | ORAL | Status: DC | PRN

## 2011-12-19 MED ORDER — BUPIVACAINE LIPOSOME 1.3 % IJ SUSP
20.0000 mL | Freq: Once | INTRAMUSCULAR | Status: AC
  Administered 2011-12-19: 20 mL
  Filled 2011-12-19: qty 20

## 2011-12-19 MED ORDER — HYDROMORPHONE 0.3 MG/ML IV SOLN
INTRAVENOUS | Status: DC
  Administered 2011-12-19: 4.8 mg via INTRAVENOUS
  Administered 2011-12-19 (×2): via INTRAVENOUS
  Administered 2011-12-19: 0.6 mg via INTRAVENOUS
  Administered 2011-12-20 (×2): via INTRAVENOUS
  Administered 2011-12-20: 3.3 mg via INTRAVENOUS
  Administered 2011-12-20: 3 mg via INTRAVENOUS
  Administered 2011-12-20: 2.94 mg via INTRAVENOUS
  Administered 2011-12-20: 23:00:00 via INTRAVENOUS
  Administered 2011-12-20: 1.5 mg via INTRAVENOUS
  Administered 2011-12-20: 3.9 mg via INTRAVENOUS
  Administered 2011-12-20 – 2011-12-21 (×2): 3.6 mg via INTRAVENOUS
  Administered 2011-12-21: 1.5 mg via INTRAVENOUS
  Administered 2011-12-21: 11:00:00 via INTRAVENOUS
  Administered 2011-12-21: 3.6 mg via INTRAVENOUS
  Administered 2011-12-21: 5.1 mg via INTRAVENOUS
  Administered 2011-12-21: 1.5 mg via INTRAVENOUS
  Administered 2011-12-22: 0.6 mg via INTRAVENOUS
  Administered 2011-12-22: 10:00:00 via INTRAVENOUS
  Administered 2011-12-22: 3.3 mg via INTRAVENOUS
  Administered 2011-12-22: 21:00:00 via INTRAVENOUS
  Administered 2011-12-22: 5.6 mg via INTRAVENOUS
  Administered 2011-12-22: 0.6 mg via INTRAVENOUS
  Administered 2011-12-22 – 2011-12-23 (×2): 1.5 mg via INTRAVENOUS
  Administered 2011-12-23: 2.5 mg via INTRAVENOUS
  Filled 2011-12-19 (×7): qty 25

## 2011-12-19 MED ORDER — GLYCOPYRROLATE 0.2 MG/ML IJ SOLN
INTRAMUSCULAR | Status: DC | PRN
  Administered 2011-12-19: 0.6 mg via INTRAVENOUS

## 2011-12-19 MED ORDER — ONDANSETRON HCL 4 MG/2ML IJ SOLN: 4.0000 mg | Freq: Four times a day (QID) | INTRAMUSCULAR | Status: DC | PRN

## 2011-12-19 MED ORDER — MIDAZOLAM HCL 5 MG/5ML IJ SOLN
INTRAMUSCULAR | Status: DC | PRN
  Administered 2011-12-19: 2 mg via INTRAVENOUS

## 2011-12-19 MED ORDER — NEOSTIGMINE METHYLSULFATE 1 MG/ML IJ SOLN
INTRAMUSCULAR | Status: DC | PRN
  Administered 2011-12-19: 4 mg via INTRAVENOUS

## 2011-12-19 MED ORDER — 0.9 % SODIUM CHLORIDE (POUR BTL) OPTIME
TOPICAL | Status: DC | PRN
  Administered 2011-12-19 (×8): 1000 mL

## 2011-12-19 MED ORDER — INSULIN ASPART 100 UNIT/ML ~~LOC~~ SOLN
0.0000 [IU] | SUBCUTANEOUS | Status: DC
  Administered 2011-12-19: 11 [IU] via SUBCUTANEOUS
  Administered 2011-12-19: 8 [IU] via SUBCUTANEOUS
  Administered 2011-12-20: 3 [IU] via SUBCUTANEOUS
  Administered 2011-12-20 (×5): 5 [IU] via SUBCUTANEOUS
  Administered 2011-12-21: 3 [IU] via SUBCUTANEOUS
  Administered 2011-12-21 (×2): 5 [IU] via SUBCUTANEOUS
  Administered 2011-12-21: 3 [IU] via SUBCUTANEOUS
  Administered 2011-12-21 (×2): 5 [IU] via SUBCUTANEOUS
  Administered 2011-12-22: 2 [IU] via SUBCUTANEOUS
  Administered 2011-12-22: 5 [IU] via SUBCUTANEOUS
  Administered 2011-12-22: 2 [IU] via SUBCUTANEOUS
  Administered 2011-12-22: 3 [IU] via SUBCUTANEOUS
  Administered 2011-12-22: 2 [IU] via SUBCUTANEOUS
  Administered 2011-12-23: 3 [IU] via SUBCUTANEOUS

## 2011-12-19 MED ORDER — DROPERIDOL 2.5 MG/ML IJ SOLN: 0.6250 mg | INTRAMUSCULAR | Status: DC | PRN

## 2011-12-19 MED ORDER — DIPHENHYDRAMINE HCL 50 MG/ML IJ SOLN: 12.5000 mg | Freq: Four times a day (QID) | INTRAMUSCULAR | Status: DC | PRN

## 2011-12-19 MED ORDER — CHLORHEXIDINE GLUCONATE 4 % EX LIQD: 1.0000 "application " | Freq: Once | CUTANEOUS | Status: DC

## 2011-12-19 MED ORDER — NALOXONE HCL 0.4 MG/ML IJ SOLN: 0.4000 mg | INTRAMUSCULAR | Status: DC | PRN

## 2011-12-19 MED ORDER — HYDROMORPHONE HCL PF 1 MG/ML IJ SOLN
0.2500 mg | INTRAMUSCULAR | Status: DC | PRN
  Administered 2011-12-19 (×4): 0.5 mg via INTRAVENOUS

## 2011-12-19 MED ORDER — HAEMOPHILUS B POLYSAC CONJ VAC IM SOLN: 0.5000 mL | Freq: Once | INTRAMUSCULAR | Status: DC

## 2011-12-19 MED ORDER — KCL IN DEXTROSE-NACL 20-5-0.45 MEQ/L-%-% IV SOLN
INTRAVENOUS | Status: DC
  Administered 2011-12-19 – 2011-12-20 (×2): via INTRAVENOUS
  Administered 2011-12-21: 125 mL via INTRAVENOUS
  Administered 2011-12-21: 20:00:00 via INTRAVENOUS
  Administered 2011-12-22: 75 mL via INTRAVENOUS
  Administered 2011-12-23: 08:00:00 via INTRAVENOUS
  Filled 2011-12-19 (×13): qty 1000

## 2011-12-19 MED ORDER — HYDROMORPHONE HCL PF 1 MG/ML IJ SOLN
INTRAMUSCULAR | Status: AC
  Filled 2011-12-19: qty 1

## 2011-12-19 MED ORDER — SODIUM CHLORIDE 0.9 % IV SOLN: INTRAVENOUS | Status: DC

## 2011-12-19 MED ORDER — ACETAMINOPHEN 10 MG/ML IV SOLN
1000.0000 mg | Freq: Four times a day (QID) | INTRAVENOUS | Status: AC
  Administered 2011-12-19 – 2011-12-20 (×4): 1000 mg via INTRAVENOUS
  Filled 2011-12-19 (×4): qty 100

## 2011-12-19 MED ORDER — PROPOFOL 10 MG/ML IV EMUL
INTRAVENOUS | Status: DC | PRN
  Administered 2011-12-19: 190 mg via INTRAVENOUS

## 2011-12-19 MED ORDER — SODIUM CHLORIDE 0.9 % IV SOLN
1.0000 g | INTRAVENOUS | Status: DC
  Filled 2011-12-19: qty 1

## 2011-12-19 MED ORDER — SODIUM CHLORIDE 0.9 % IJ SOLN
INTRAMUSCULAR | Status: DC | PRN
  Administered 2011-12-19: 10 mL

## 2011-12-19 MED ORDER — LACTATED RINGERS IV SOLN
INTRAVENOUS | Status: DC | PRN
  Administered 2011-12-19 (×2): via INTRAVENOUS

## 2011-12-19 MED ORDER — SODIUM CHLORIDE 0.9 % IJ SOLN: 9.0000 mL | INTRAMUSCULAR | Status: DC | PRN

## 2011-12-19 MED ORDER — KCL IN DEXTROSE-NACL 20-5-0.45 MEQ/L-%-% IV SOLN
INTRAVENOUS | Status: AC
  Filled 2011-12-19: qty 1000

## 2011-12-19 MED ORDER — SODIUM CHLORIDE 0.9 % IV SOLN
1.0000 g | INTRAVENOUS | Status: DC
  Administered 2011-12-19 – 2011-12-23 (×6): 1 g via INTRAVENOUS
  Filled 2011-12-19 (×6): qty 1

## 2011-12-19 MED ORDER — SODIUM CHLORIDE 0.9 % IV SOLN
1.0000 g | INTRAVENOUS | Status: DC | PRN
  Administered 2011-12-19: 1 g via INTRAVENOUS

## 2011-12-19 MED ORDER — LIDOCAINE HCL (CARDIAC) 20 MG/ML IV SOLN
INTRAVENOUS | Status: DC | PRN
  Administered 2011-12-19: 100 mg via INTRAVENOUS

## 2011-12-19 MED ORDER — HAEMOPHILUS B POLYSAC CONJ VAC IM SOLR
0.5000 mL | Freq: Once | INTRAMUSCULAR | Status: DC
  Filled 2011-12-19: qty 0.5

## 2011-12-19 MED ORDER — ALBUTEROL SULFATE HFA 108 (90 BASE) MCG/ACT IN AERS
INHALATION_SPRAY | RESPIRATORY_TRACT | Status: DC | PRN
  Administered 2011-12-19: 2 via RESPIRATORY_TRACT

## 2011-12-19 MED ORDER — ALBUMIN HUMAN 5 % IV SOLN
INTRAVENOUS | Status: DC | PRN
  Administered 2011-12-19 (×2): via INTRAVENOUS

## 2011-12-19 MED ORDER — ROCURONIUM BROMIDE 100 MG/10ML IV SOLN
INTRAVENOUS | Status: DC | PRN
  Administered 2011-12-19: 20 mg via INTRAVENOUS
  Administered 2011-12-19: 50 mg via INTRAVENOUS

## 2011-12-19 MED ORDER — ENOXAPARIN SODIUM 40 MG/0.4ML ~~LOC~~ SOLN
40.0000 mg | SUBCUTANEOUS | Status: DC
  Administered 2011-12-20 – 2011-12-21 (×2): 40 mg via SUBCUTANEOUS
  Filled 2011-12-19 (×3): qty 0.4

## 2011-12-19 MED ORDER — DIPHENHYDRAMINE HCL 12.5 MG/5ML PO ELIX
12.5000 mg | ORAL_SOLUTION | Freq: Four times a day (QID) | ORAL | Status: DC | PRN
  Filled 2011-12-19: qty 5

## 2011-12-19 MED ORDER — HAEMOPHILUS B POLYSAC CONJ VAC IM SOLN
0.5000 mL | Freq: Once | INTRAMUSCULAR | Status: DC
  Filled 2011-12-19: qty 0.5

## 2011-12-19 MED ORDER — MENINGOCOCCAL VAC A,C,Y,W-135 ~~LOC~~ INJ
0.5000 mL | INJECTION | Freq: Once | SUBCUTANEOUS | Status: DC
  Filled 2011-12-19: qty 0.5

## 2011-12-19 MED ORDER — HYDROMORPHONE 0.3 MG/ML IV SOLN
INTRAVENOUS | Status: AC
  Filled 2011-12-19: qty 25

## 2011-12-19 MED ORDER — PNEUMOCOCCAL VAC POLYVALENT 25 MCG/0.5ML IJ INJ
0.5000 mL | INJECTION | INTRAMUSCULAR | Status: AC
  Filled 2011-12-19: qty 0.5

## 2011-12-19 MED ORDER — ONDANSETRON HCL 4 MG/2ML IJ SOLN
INTRAMUSCULAR | Status: DC | PRN
  Administered 2011-12-19: 4 mg via INTRAVENOUS

## 2011-12-19 MED ORDER — BIOTENE DRY MOUTH MT LIQD
15.0000 mL | Freq: Two times a day (BID) | OROMUCOSAL | Status: DC
  Administered 2011-12-20 – 2011-12-24 (×8): 15 mL via OROMUCOSAL

## 2011-12-19 MED ORDER — FENTANYL CITRATE 0.05 MG/ML IJ SOLN
INTRAMUSCULAR | Status: DC | PRN
  Administered 2011-12-19 (×2): 50 ug via INTRAVENOUS
  Administered 2011-12-19: 150 ug via INTRAVENOUS

## 2011-12-19 MED ORDER — HYDROMORPHONE HCL PF 1 MG/ML IJ SOLN
0.5000 mg | INTRAMUSCULAR | Status: DC | PRN
  Administered 2011-12-19 (×2): 0.5 mg via INTRAVENOUS

## 2011-12-19 SURGICAL SUPPLY — 60 items
APPLIER CLIP 11 MED OPEN (CLIP)
APPLIER CLIP 13 LRG OPEN (CLIP)
BLADE SURG ROTATE 9660 (MISCELLANEOUS) IMPLANT
CANISTER SUCTION 2500CC (MISCELLANEOUS) ×2 IMPLANT
CHLORAPREP W/TINT 26ML (MISCELLANEOUS) ×2 IMPLANT
CLIP APPLIE 11 MED OPEN (CLIP) IMPLANT
CLIP APPLIE 13 LRG OPEN (CLIP) IMPLANT
CLIP TI LARGE 6 (CLIP) ×2 IMPLANT
CLOTH BEACON ORANGE TIMEOUT ST (SAFETY) ×2 IMPLANT
COVER SURGICAL LIGHT HANDLE (MISCELLANEOUS) ×2 IMPLANT
DECANTER SPIKE VIAL GLASS SM (MISCELLANEOUS) ×2 IMPLANT
DRAIN CHANNEL 19F RND (DRAIN) ×2 IMPLANT
DRAPE LAPAROSCOPIC ABDOMINAL (DRAPES) ×2 IMPLANT
DRAPE UTILITY 15X26 W/TAPE STR (DRAPE) ×4 IMPLANT
DRSG VAC ATS SM SENSATRAC (GAUZE/BANDAGES/DRESSINGS) ×2 IMPLANT
ELECT BLADE 6.5 EXT (BLADE) ×2 IMPLANT
ELECT REM PT RETURN 9FT ADLT (ELECTROSURGICAL) ×2
ELECTRODE REM PT RTRN 9FT ADLT (ELECTROSURGICAL) ×1 IMPLANT
EVACUATOR SILICONE 100CC (DRAIN) ×2 IMPLANT
GLOVE BIO SURGEON STRL SZ7.5 (GLOVE) ×8 IMPLANT
GLOVE BIO SURGEON STRL SZ8 (GLOVE) ×6 IMPLANT
GLOVE BIOGEL PI IND STRL 8 (GLOVE) ×1 IMPLANT
GLOVE BIOGEL PI INDICATOR 8 (GLOVE) ×1
GLOVE ECLIPSE 6.5 STRL STRAW (GLOVE) ×2 IMPLANT
GLOVE SURG SS PI 6.5 STRL IVOR (GLOVE) ×2 IMPLANT
GOWN STRL NON-REIN LRG LVL3 (GOWN DISPOSABLE) ×10 IMPLANT
KIT BASIN OR (CUSTOM PROCEDURE TRAY) ×2 IMPLANT
KIT ROOM TURNOVER OR (KITS) ×2 IMPLANT
LIGASURE IMPACT 36 18CM CVD LR (INSTRUMENTS) ×2 IMPLANT
MANIFOLD NEPTUNE II (INSTRUMENTS) ×2 IMPLANT
NEEDLE 22X1 1/2 (OR ONLY) (NEEDLE) ×2 IMPLANT
NS IRRIG 1000ML POUR BTL (IV SOLUTION) ×16 IMPLANT
PACK GENERAL/GYN (CUSTOM PROCEDURE TRAY) ×2 IMPLANT
PAD ARMBOARD 7.5X6 YLW CONV (MISCELLANEOUS) ×2 IMPLANT
SPECIMEN JAR X LARGE (MISCELLANEOUS) IMPLANT
SPONGE GAUZE 4X4 12PLY (GAUZE/BANDAGES/DRESSINGS) ×2 IMPLANT
SPONGE INTESTINAL PEANUT (DISPOSABLE) IMPLANT
SPONGE LAP 18X18 X RAY DECT (DISPOSABLE) ×8 IMPLANT
SPONGE SURGIFOAM ABS GEL 100 (HEMOSTASIS) IMPLANT
STAPLER VISISTAT 35W (STAPLE) ×2 IMPLANT
SUCTION POOLE TIP (SUCTIONS) ×2 IMPLANT
SUT ETHILON 2 0 FS 18 (SUTURE) ×2 IMPLANT
SUT PDS AB 1 TP1 96 (SUTURE) ×8 IMPLANT
SUT SILK 0 TIES 10X30 (SUTURE) ×2 IMPLANT
SUT SILK 2 0 SH (SUTURE) ×2 IMPLANT
SUT SILK 2 0 TIES 10X30 (SUTURE) ×2 IMPLANT
SUT SILK 2 0SH CR/8 30 (SUTURE) ×2 IMPLANT
SUT VIC AB 2-0 CT1 27 (SUTURE) ×4
SUT VIC AB 2-0 CT1 TAPERPNT 27 (SUTURE) ×4 IMPLANT
SUT VIC AB 3-0 SH 18 (SUTURE) ×2 IMPLANT
SWAB COLLECTION DEVICE MRSA (MISCELLANEOUS) ×2 IMPLANT
SWAB CULTURE LIQUID MINI MALE (MISCELLANEOUS) ×2 IMPLANT
SYR CONTROL 10ML LL (SYRINGE) ×2 IMPLANT
TAPE CLOTH SURG 4X10 WHT LF (GAUZE/BANDAGES/DRESSINGS) ×2 IMPLANT
TOWEL OR 17X24 6PK STRL BLUE (TOWEL DISPOSABLE) ×2 IMPLANT
TOWEL OR 17X26 10 PK STRL BLUE (TOWEL DISPOSABLE) ×2 IMPLANT
TOWEL OR NON WOVEN STRL DISP B (DISPOSABLE) ×2 IMPLANT
TRAY FOLEY CATH 14FRSI W/METER (CATHETERS) ×2 IMPLANT
WATER STERILE IRR 1000ML POUR (IV SOLUTION) IMPLANT
YANKAUER SUCT BULB TIP NO VENT (SUCTIONS) ×4 IMPLANT

## 2011-12-19 NOTE — Op Note (Signed)
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  Assistant: Chevis Pretty M.D.  Anesthesia: Gen. endotracheal anesthesia with Exparel 20 cc 20 ccsaline  IV fluids: 2600 cc crystalloid 500 cc albumin one unit packed RBC  Drain: 19 round drain to left upper quadrant  Specimen: Splenic remnant abscess cavity and cultures sent  Indications for procedure: The patient presents due to large splenic cyst. She has a history of atheroembolic embolization 4 months ago. She will call large splenic cyst which is not responded to medical management. She is significant left upper quadrant pain, early satiety and some shortness of breath. By CT scan, the cyst was 19 cm and not amenable to laparoscopic resection. I discussed options of observation, open splenectomy and laparoscopic splenectomy with complications and risks of each. Risk of bleeding, infection, post splenectomy sepsis, pancreas injury, stomach injury, diaphragm injury, esophageal injury, colonic injury, as well as postoperative abscess formation, wound complications, hernia formation, DVT, and exacerbation of underlying medical conditions and death. She was to proceed with open splenectomy after discussion of options.  Description of procedure: The patient was met in the holding area and questions are answered. She's taken back to the operating room and placed supine on the operating room table. After induction of general anesthesia, a Foley catheter was placed under sterile conditions and the abdomen was prepped and draped in a sterile fashion. Timeout was done and she received 1 g of vancomycin. Left upper quarter incision was made just below the left costal margin. Dissection was carried down to the layers of the abdominal wall until into the abdominal cavity. Upon inspection, there is an inflamed mass in the left upper quadrant that was stuck to the  diaphragm and the lateral abdominal wall. This was severely inflamed and was also densely adherent to the stomach. A Bookwalter retractor was placed. I was able to bluntly dissect around the spleen but it contained a large fluid collection. This was decompressed and was found full of pus. Cultures were taken. We will to mobilize the splenic abscess and spleen posteriorly. This was densely adherent to the stomach and carefully dissected off the stomach using LigaSure to ligate the short gastric vessels. We delivered was left in the spleen into the field and found the splenic artery and splenic vein these were clamped and the spleen was removed. The pedicle was oversewn with 2-0 Vicryl stick ties a. The abscess cavity was 19 cm it was up To. The capsule was densely adherent to the diaphragm and so we debrided this is much we could without injuring the diaphragm. It was also a dense adherent to the lateral wall the stomach up toward the gastroesophageal junction. Some of the rind was left so that the stomach, esophagus and diaphragm are not injured. The area was copiously irrigated and found hemostatic. The area was wall in appearance of the bleeding was minimal. Nasogastric tube is positioned in the stomach. JP drain was placed through separate stab incision just under left hemidiaphragm. It was secured to skin with 2-0 nylon. A liters crystalloid were used to irrigate the abdomen. This was clear. All sponge, needles and found to be correct this portion the case. The retractor was removed. The abdominal wall layers were closed using #1 PDS. The skin was irrigated and wound VAC applied. All final counts are found to be correct. This is placed to suction without difficulty. JVP placed to suction. Estimated blood loss  250 cc. Proximally 800 cc pus. The patient was awoke taken to recovery in satisfactory condition

## 2011-12-19 NOTE — Anesthesia Postprocedure Evaluation (Signed)
  Anesthesia Post-op Note  Patient: CHRISTEL BAI  Procedure(s) Performed: Procedure(s) (LRB): SPLENECTOMY (N/A)  Patient Location: PACU  Anesthesia Type: General  Level of Consciousness: awake, alert  and oriented  Airway and Oxygen Therapy: Patient Spontanous Breathing and Patient connected to nasal cannula oxygen  Post-op Pain: mild  Post-op Assessment: Post-op Vital signs reviewed, Patient's Cardiovascular Status Stable, Respiratory Function Stable, Patent Airway, No signs of Nausea or vomiting and Pain level controlled  Post-op Vital Signs: Reviewed and stable  Complications: No apparent anesthesia complications

## 2011-12-19 NOTE — Transfer of Care (Signed)
Immediate Anesthesia Transfer of Care Note  Patient: Kristy Stewart  Procedure(s) Performed: Procedure(s) (LRB): SPLENECTOMY (N/A)  Patient Location: PACU  Anesthesia Type: General  Level of Consciousness: awake, alert , oriented and patient cooperative  Airway & Oxygen Therapy: Patient Spontanous Breathing and Patient connected to face mask oxygen  Post-op Assessment: Report given to PACU RN, Post -op Vital signs reviewed and stable and Patient moving all extremities  Post vital signs: Reviewed and stable  Complications: No apparent anesthesia complications

## 2011-12-19 NOTE — Progress Notes (Addendum)
Coags, type and screen drawn this am  low bp called to dr singer anesthesia and dr Luisa Hart. Orders given per dr Luisa Hart

## 2011-12-19 NOTE — Anesthesia Procedure Notes (Signed)
Procedure Name: Intubation Date/Time: 12/19/2011 10:17 AM Performed by: Jerilee Hoh Pre-anesthesia Checklist: Emergency Drugs available, Patient identified, Suction available and Patient being monitored Patient Re-evaluated:Patient Re-evaluated prior to inductionOxygen Delivery Method: Circle system utilized Preoxygenation: Pre-oxygenation with 100% oxygen Intubation Type: IV induction Ventilation: Mask ventilation without difficulty Laryngoscope Size: Mac and 3 Grade View: Grade I Tube type: Oral Tube size: 7.5 mm Number of attempts: 1 Airway Equipment and Method: Stylet Placement Confirmation: ETT inserted through vocal cords under direct vision,  positive ETCO2 and breath sounds checked- equal and bilateral Secured at: 22 cm Tube secured with: Tape Dental Injury: Teeth and Oropharynx as per pre-operative assessment

## 2011-12-19 NOTE — H&P (View-Only) (Signed)
Patient ID: Kristy Stewart, female   DOB: 03/10/1956, 56 y.o.   MRN: [id number]  Chief Complaint  Patient presents with  . Pre-op Exam    eval of spleenectomy    HPI Kristy Stewart is a 56 y.o. female.   HPIPatient sent at the request of Dr. fields due to her history of splenic infarction and subsequent splenic cyst. 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 She is on Coumadin now. 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 This is quite large. The spleen is at least 3-4 times its normal size on CT. It is causing significant left upper quadrant pain back pain and shoulder pain. She requires chronic narcotics for this. An attempt to follow this nonoperatively has been unsuccessful the last 3 months. The cyst is not resolving by CT and it is impacting her quality-of-life. She feels tired today. She has had a chronic cough.  Past Medical History  Diagnosis Date  . Diabetes mellitus   . Arthritis   . Allergy   . GERD (gastroesophageal reflux disease)   . Anxiety   . Depression   . Hypertension   . 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 History of blood clots 08/2011    spleen and kidney; "from ulcer on my aorta"  . Bronchitis 08/2011  . H/O hiatal hernia   . Migraines     "gone since starting BP meds 2007"  . Spleen injury 08/2011    "from blood clot from ulcer on aorta"    Past Surgical History  Procedure Date  . Knee arthroscopy 04/2009    left    Family History  Problem Relation Age of Onset  . Heart disease Father   . Cancer Father     prostate  . Cancer Sister     ovarian    Social History History  Substance Use Topics  . Smoking status: Current Everyday Smoker -- 0.2 packs/day for 37 years    Types: Cigars  . Smokeless tobacco: Never Used   Comment: 8cigars a day  . Alcohol Use: No    Allergies  Allergen  Reactions  . Codeine Other (See Comments)    Violently ill; reports having taken Vicodin previously without problems  . Erythromycin Nausea And Vomiting  . Lactose Intolerance (Gi) Other (See Comments)    GI upset  . Penicillins Nausea Only    Current Outpatient Prescriptions  Medication Sig Dispense Refill  . albuterol (PROVENTIL HFA;VENTOLIN HFA) 108 (90 BASE) MCG/ACT inhaler Inhale 2 puffs into the lungs every 4 (four) hours as needed. For shortness of breath      . ALPRAZolam (XANAX) 0.5 MG tablet Take 0.25 mg by mouth 2 (two) times daily as needed. For anxiety      . buPROPion (WELLBUTRIN) 75 MG tablet Take 75 mg by mouth 2 (two) times daily.        . cholestyramine (QUESTRAN) 4 GM/DOSE powder Take 4 g by mouth every morning.        Marland Kitchen glipiZIDE (GLUCOTROL) 10 MG tablet Take 10 mg by mouth 2 (two) times daily before a meal.      . HYDROcodone-acetaminophen (NORCO) 5-325 MG per tablet Take 1 tablet by mouth every 4 (four) hours as needed for pain.  30 tablet  0  . lisinopril-hydrochlorothiazide (PRINZIDE,ZESTORETIC) 20-12.5 MG per tablet  Take 1 tablet by mouth every morning.        Marland Kitchen OVER THE COUNTER MEDICATION Place 2 drops into both eyes daily as needed. Saline drop for dry eyes       . pantoprazole (PROTONIX) 40 MG tablet Take 40 mg by mouth 2 (two) times daily.      . sitaGLIPtin (JANUVIA) 100 MG tablet Take 100 mg by mouth daily.      . traZODone (DESYREL) 100 MG tablet Take 100 mg by mouth at bedtime.        Marland Kitchen venlafaxine (EFFEXOR-XR) 75 MG 24 hr capsule Take 1 capsule (75 mg total) by mouth every evening.  1 capsule  0  . warfarin (COUMADIN) 5 MG tablet Take 5 mg by mouth one time only at 6 PM. PATIENT TAKING 2.5 MG M,T, AND S  5MG  ON Sunday, WED AND FRI        Review of Systems Review of Systems  Constitutional: Positive for activity change, appetite change and fatigue. Negative for fever.  HENT: Negative.   Eyes: Negative.   Respiratory: Positive for cough.     Cardiovascular: Negative.   Gastrointestinal: Positive for vomiting, abdominal pain and diarrhea.  Genitourinary: Negative.   Musculoskeletal: Negative.   Skin: Negative.   Hematological: Bruises/bleeds easily.  Psychiatric/Behavioral: Positive for dysphoric mood.    Blood pressure 120/67, pulse 118, temperature 98.6 F (37 C), temperature source Temporal, height 5\' 7"  (1.702 m), weight 222 lb 12.8 oz (101.061 kg), SpO2 97.00%.  Physical Exam Physical Exam  Constitutional: She is oriented to person, place, and time. She appears well-developed and well-nourished.  HENT:  Head: Normocephalic and atraumatic.  Eyes: EOM are normal. Pupils are equal, round, and reactive to light.  Neck: Normal range of motion. Neck supple.  Cardiovascular: Normal rate and regular rhythm.   Pulmonary/Chest: Effort normal and breath sounds normal. No respiratory distress.  Abdominal: Soft. Bowel sounds are normal. There is tenderness.    Neurological: She is alert and oriented to person, place, and time. GCS eye subscore is 4. GCS verbal subscore is 5. GCS motor subscore is 6.  Skin: Skin is warm, dry and intact.  Psychiatric: She exhibits a depressed mood.    Data Reviewed Dr Darrick Penna notes,  CT-scan of the abdomen  And blood work.  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 Diabetes mellitus   . Arthritis   . Allergy   . GERD (gastroesophageal reflux disease)   . Anxiety   . Depression   . Hypertension   . 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 History of blood clots 08/2011    spleen and kidney; "from ulcer on my aorta"  . Bronchitis 08/2011  . H/O hiatal hernia   . Migraines     "gone since starting BP meds 2007"  . Spleen injury 08/2011    "from blood clot from ulcer on aorta"       Plan    Options include observation, aspiration were splenectomy. She has failed medical management at this point in time therefore I do not believe observation will make his condition better. Aspiration has high risk of bleeding and may not resolve the problem completely. Splenectomy is probably her best choice at this point. Spleen is quite large and therefore  laparoscopic approach I feel would be difficult to do a potential dangerous. Open splenectomy is recommended. Risks include bleeding, infection, postsplenectomy sepsis, pancreas injury, intra-bowel abscess, wound complication, pulmonary consultation, cardiovascular event, blood clots, and the need for further surgery. She elected she was splenectomy. Questions were answered. I will write for her vaccinations to be given preoperatively.       Juventino Pavone A. 11/30/2011, 11:09 AM

## 2011-12-19 NOTE — Progress Notes (Signed)
12/19/2011 1600 Pt arrived from pacu, vss. Oriented to unit and safety discussed. Bed in low position, bed alarm set, and call bell with in reach. Patient has NG tube clamped but without an order for NG tube. Called MD and received orders to place to low wall intermittent suction. Placed to suction. Family at bedside. Will continue to monitor. Celesta Gentile

## 2011-12-19 NOTE — Anesthesia Preprocedure Evaluation (Addendum)
Anesthesia Evaluation  Patient identified by MRN, date of birth, ID band Patient awake    Reviewed: Allergy & Precautions, H&P , NPO status , Patient's Chart, lab work & pertinent test results, reviewed documented beta blocker date and time   Airway Mallampati: II TM Distance: >3 FB Neck ROM: Full    Dental   Pulmonary  breath sounds clear to auscultation  Pulmonary exam normal       Cardiovascular hypertension, Pt. on medications Rhythm:Regular Rate:Normal     Neuro/Psych  Headaches, Anxiety Depression    GI/Hepatic hiatal hernia, GERD-  Medicated,  Endo/Other  Diabetes mellitus-  Renal/GU      Musculoskeletal   Abdominal   Peds  Hematology   Anesthesia Other Findings   Reproductive/Obstetrics                          Anesthesia Physical Anesthesia Plan  ASA: III  Anesthesia Plan: General   Post-op Pain Management:    Induction: Intravenous  Airway Management Planned: Oral ETT  Additional Equipment:   Intra-op Plan:   Post-operative Plan: Extubation in OR  Informed Consent: I have reviewed the patients History and Physical, chart, labs and discussed the procedure including the risks, benefits and alternatives for the proposed anesthesia with the patient or authorized representative who has indicated his/her understanding and acceptance.   Dental advisory given  Plan Discussed with: CRNA, Anesthesiologist and Surgeon  Anesthesia Plan Comments:         Anesthesia Quick Evaluation

## 2011-12-19 NOTE — Interval H&P Note (Signed)
History and Physical Interval Note:  12/19/2011 8:21 AM  Kristy Stewart  has presented today for surgery, with the diagnosis of splenic cyst  The various methods of treatment have been discussed with the patient and family. After consideration of risks, benefits and other options for treatment, the patient has consented to  Procedure(s) (LRB): SPLENECTOMY (N/A) as a surgical intervention .  The patients' history has been reviewed, patient examined, no change in status, stable for surgery.  I have reviewed the patients' chart and labs.  Questions were answered to the patient's satisfaction.     Jorge Amparo A.

## 2011-12-19 NOTE — Preoperative (Signed)
Beta Blockers   Reason not to administer Beta Blockers:Not Applicable 

## 2011-12-20 ENCOUNTER — Encounter (HOSPITAL_COMMUNITY): Payer: Self-pay | Admitting: Surgery

## 2011-12-20 LAB — GLUCOSE, CAPILLARY
Glucose-Capillary: 231 mg/dL — ABNORMAL HIGH (ref 70–99)
Glucose-Capillary: 231 mg/dL — ABNORMAL HIGH (ref 70–99)
Glucose-Capillary: 188 mg/dL — ABNORMAL HIGH (ref 70–99)

## 2011-12-20 LAB — BASIC METABOLIC PANEL
BUN: 12 mg/dL (ref 6–23)
CO2: 22 mEq/L (ref 19–32)
Chloride: 95 mEq/L — ABNORMAL LOW (ref 96–112)
GFR calc Af Amer: >90 mL/min (ref >90)
Potassium: 4.9 mEq/L (ref 3.5–5.1)

## 2011-12-20 LAB — CBC
HCT: 31.2 % — ABNORMAL LOW (ref 36.0–46.0)
Hemoglobin: 10.3 g/dL — ABNORMAL LOW (ref 12.0–15.0)
MCHC: 33 g/dL (ref 30.0–36.0)
MCV: 78.8 fL (ref 78.0–100.0)
WBC: 17.9 10*3/uL — ABNORMAL HIGH (ref 4.0–10.5)

## 2011-12-20 LAB — HEMOGLOBIN A1C: Hgb A1c MFr Bld: 8.6 % — ABNORMAL HIGH (ref <5.7)

## 2011-12-20 MED ORDER — HAEMOPHILUS B POLYSAC CONJ VAC IM SOLR
0.5000 mL | Freq: Once | INTRAMUSCULAR | Status: AC
  Administered 2011-12-21: 0.5 mL via INTRAMUSCULAR
  Filled 2011-12-20: qty 0.5

## 2011-12-20 MED ORDER — VANCOMYCIN HCL IN DEXTROSE 1-5 GM/200ML-% IV SOLN
1000.0000 mg | Freq: Three times a day (TID) | INTRAVENOUS | Status: DC
  Administered 2011-12-20 – 2011-12-21 (×6): 1000 mg via INTRAVENOUS
  Filled 2011-12-20 (×8): qty 200

## 2011-12-20 MED ORDER — VANCOMYCIN HCL IN DEXTROSE 1-5 GM/200ML-% IV SOLN: 1000.0000 mg | Freq: Two times a day (BID) | INTRAVENOUS | Status: DC

## 2011-12-20 MED ORDER — HAEMOPHILUS B POLYSAC CONJ VAC IM SOLN
0.5000 mL | Freq: Once | INTRAMUSCULAR | Status: DC
  Filled 2011-12-20: qty 0.5

## 2011-12-20 MED ORDER — HAEMOPHILUS B POLYSAC CONJ VAC IM SOLR
0.5000 mL | Freq: Once | INTRAMUSCULAR | Status: DC
  Filled 2011-12-20: qty 0.5

## 2011-12-20 MED ORDER — MENINGOCOCCAL VAC A,C,Y,W-135 ~~LOC~~ INJ
0.5000 mL | INJECTION | Freq: Once | SUBCUTANEOUS | Status: AC
  Administered 2011-12-20: 0.5 mL via SUBCUTANEOUS
  Filled 2011-12-20: qty 0.5

## 2011-12-20 MED ORDER — SODIUM CHLORIDE 0.9 % IV BOLUS (SEPSIS)
1000.0000 mL | Freq: Once | INTRAVENOUS | Status: AC
  Administered 2011-12-20: 1000 mL via INTRAVENOUS

## 2011-12-20 NOTE — Progress Notes (Signed)
See progress note.

## 2011-12-20 NOTE — Progress Notes (Signed)
ANTIBIOTIC CONSULT NOTE - INITIAL  Pharmacy Consult for vancomcyin Indication: splenic abscess  Allergies  Allergen Reactions  . Codeine Other (See Comments)    Violently ill; reports having taken Vicodin previously without problems  . Erythromycin Nausea And Vomiting  . Lactose Intolerance (Gi) Other (See Comments)    GI upset  . Penicillins Nausea Only    Patient Measurements: Height: 5\' 7"  (170.2 cm) Weight: 214 lb 15.2 oz (97.5 kg) IBW/kg (Calculated) : 61.6   Vital Signs: Temp: 97.4 F (36.3 C) (05/02 0400) Temp src: Oral (05/02 0400) BP: 100/63 mmHg (05/02 0420) Pulse Rate: 110  (05/02 0420)  Labs:  Basename 12/20/11 0415 12/19/11 1606 12/19/11 1139  WBC 17.9* 17.0* --  HGB 10.3* 10.4* 9.2*  PLT 528* 525* --  LABCREA -- -- --  CREATININE 0.69 0.87 --   Estimated Creatinine Clearance: 97.6 ml/min (by C-G formula based on Cr of 0.69).  Microbiology: Recent Results (from the past 720 hour(s))  SURGICAL PCR SCREEN     Status: Normal   Collection Time   12/12/11  1:36 PM      Component Value Range Status Comment   MRSA, PCR NEGATIVE  NEGATIVE  Final    Staphylococcus aureus NEGATIVE  NEGATIVE  Final   CULTURE, ROUTINE-ABSCESS     Status: Normal (Preliminary result)   Collection Time   12/19/11 11:31 AM      Component Value Range Status Comment   Specimen Description ABSCESS ABDOMEN   Final    Special Requests SPLENIC ABSCESS   Final    Gram Stain     Final    Value: RARE WBC PRESENT,BOTH PMN AND MONONUCLEAR     RARE SQUAMOUS EPITHELIAL CELLS PRESENT     FEW GRAM POSITIVE COCCI IN PAIRS   Culture PENDING   Incomplete    Report Status PENDING   Incomplete     Medical History: Past Medical History  Diagnosis Date  . Arthritis   . Allergy   . GERD (gastroesophageal reflux disease)   . Anxiety   . Depression   . Hypertension   . 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 History of blood clots 08/2011    spleen and  kidney; "from ulcer on my aorta"  . Bronchitis 08/2011  . H/O hiatal hernia   . Migraines     "gone since starting BP meds 2007"  . 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 Diabetes mellitus     Type 2 NIDDM x 3 years  . Splenic cyst   . Aortic thrombus 2013    Medications:  Prescriptions prior to admission  Medication Sig Dispense Refill  . ALPRAZolam (XANAX) 0.5 MG tablet Take 0.25 mg by mouth 2 (two) times daily as needed. For anxiety      . buPROPion (WELLBUTRIN) 75 MG tablet Take 75 mg by mouth 2 (two) times daily.        . cholestyramine (QUESTRAN) 4 GM/DOSE powder Take 4 g by mouth every morning.        Marland Kitchen glipiZIDE (GLUCOTROL) 10 MG tablet Take 10-20 mg by mouth 2 (two) times daily before a meal. Take 2 tablets in the morning and 1 tablet in the evening      . GLUCOSAMINE-CHONDROITIN PO Take 1 tablet by mouth daily.      Marland Kitchen HYDROcodone-acetaminophen (NORCO) 5-325 MG per tablet Take 1 tablet  by mouth every 4 (four) hours as needed for pain.  30 tablet  0  . lisinopril-hydrochlorothiazide (PRINZIDE,ZESTORETIC) 20-12.5 MG per tablet Take 1 tablet by mouth every morning.        Marland Kitchen OVER THE COUNTER MEDICATION Place 2 drops into both eyes daily as needed. Saline drop for dry eyes       . pantoprazole (PROTONIX) 40 MG tablet Take 40 mg by mouth 2 (two) times daily.      . sitaGLIPtin (JANUVIA) 100 MG tablet Take 100 mg by mouth daily.      . traZODone (DESYREL) 100 MG tablet Take 100 mg by mouth at bedtime.        Marland Kitchen venlafaxine (EFFEXOR-XR) 75 MG 24 hr capsule Take 1 capsule (75 mg total) by mouth every evening.  1 capsule  0  . warfarin (COUMADIN) 10 MG tablet Take 10 mg by mouth 3 (three) times a week. Takes on Sun, Weds, and Fri only      . warfarin (COUMADIN) 2.5 MG tablet Take 2.5 mg by mouth 4 (four) times a week. Takes on Mon, Tues, Thurs, and Sat only      . zinc sulfate 220 MG capsule Take 220 mg by mouth daily.      Marland Kitchen albuterol (PROVENTIL HFA;VENTOLIN  HFA) 108 (90 BASE) MCG/ACT inhaler Inhale 2 puffs into the lungs every 4 (four) hours as needed. For shortness of breath       Scheduled:    . acetaminophen  1,000 mg Intravenous Q6H  . antiseptic oral rinse  15 mL Mouth Rinse BID  . bupivacaine liposome  20 mL Infiltration Once  . dextrose 5 % and 0.45 % NaCl with KCl 20 mEq/L      . enoxaparin  40 mg Subcutaneous Q24H  . ertapenem (INVANZ) IV  1 g Intravenous Q24H  . haemophilus B conjugate vaccine  0.5 mL Intramuscular Once  . haemophilus B polysaccharide conjugate vaccine  0.5 mL Intramuscular Once  . HYDROmorphone      . HYDROmorphone      . HYDROmorphone PCA 0.3 mg/mL   Intravenous Q4H  . HYDROmorphone PCA 0.3 mg/mL      . insulin aspart  0-15 Units Subcutaneous Q4H  . meningococcal vaccine  0.5 mL Subcutaneous Once  . pneumococcal 23 valent vaccine  0.5 mL Intramuscular Tomorrow-1000  . vancomycin  1,500 mg Intravenous 120 min pre-op  . vancomycin  1,000 mg Intravenous Q8H  . DISCONTD: chlorhexidine  1 application Topical Once  . DISCONTD: ertapenem  1 g Intravenous To OR  . DISCONTD: haemophilus B conjugate vaccine  0.5 mL Intramuscular Once  . DISCONTD: haemophilus B conjugate vaccine  0.5 mL Intramuscular Once  . DISCONTD: haemophilus B polysaccharide conjugate vaccine  0.5 mL Intramuscular Once  . DISCONTD: meningococcal vaccine  0.5 mL Subcutaneous Once  . DISCONTD: vancomycin  1,000 mg Intravenous Q12H   Assessment: 56yo female admitted for surgical intervention for postinfarction cyst in the spleen unresolved and no improvement with surgery, found with large abscess of spleen, now s/p splenectomy and Cx obtained when debrided, to begin IV ABX for infection.  Goal of Therapy:  Vancomycin trough level 15-20 mcg/ml  Plan:  Will begin vancomycin 1000mg  IV Q8H and monitor CBC, Cx, levels prn.  Colleen Can PharmD BCPS 12/20/2011,7:22 AM

## 2011-12-20 NOTE — Progress Notes (Signed)
Patient had an 18 beat run of VTACH at 02:55.  VSS with BP 105/52, HR 112, 02 94 with 2L O2 [place], RR 16.  She did not report any symptoms.  On call doctor, Dr. Lindie Spruce, was notified.  Was told to continue to monitor patient and call back if need be.   Vivi Martens, RN

## 2011-12-20 NOTE — Progress Notes (Signed)
UR complete 

## 2011-12-20 NOTE — Progress Notes (Signed)
Tried to get patient up to chair. Patient stated while sitting on the edge of bed that she felt dizzy and SOB. Patient helped back into bed, VVS and patient now resting comfortable. Will continue to monitor.

## 2011-12-20 NOTE — Progress Notes (Signed)
1 Day Post-Op  Subjective: Pt sore.  Has sore throat.  Objective: Vital signs in last 24 hours: Temp:  [97 F (36.1 C)-98.4 F (36.9 C)] 97.4 F (36.3 C) (05/02 0400) Pulse Rate:  [88-112] 110  (05/02 0420) Resp:  [14-23] 17  (05/02 0437) BP: (79-125)/(52-93) 100/63 mmHg (05/02 0420) SpO2:  [94 %-97 %] 95 % (05/02 0437) Weight:  [214 lb 15.2 oz (97.5 kg)] 214 lb 15.2 oz (97.5 kg) (05/01 1600) Last BM Date: 12/19/11 (per patient; this am before surgery )  Intake/Output from previous day: 05/01 0701 - 05/02 0700 In: 4325 [I.V.:3475; Blood:350; IV Piggyback:500] Out: 2655 [Urine:1250; Emesis/NG output:475; Drains:230; Blood:700] Intake/Output this shift: Total I/O In: 375 [I.V.:375] Out: 1180 [Urine:800; Emesis/NG output:250; Drains:130]  Cardio: tachycardia Incision/Wound:wound vac in place  Lab Results:   Basename 12/20/11 0415 12/19/11 1606  WBC 17.9* 17.0*  HGB 10.3* 10.4*  HCT 31.2* 31.4*  PLT 528* 525*   BMET  Basename 12/20/11 0415 12/19/11 1606 12/19/11 1139  NA 126* -- 134*  K 4.9 -- 3.0*  CL 95* -- --  CO2 22 -- --  GLUCOSE 215* -- --  BUN 12 -- --  CREATININE 0.69 0.87 --  CALCIUM 8.2* -- --   PT/INR  Basename 12/19/11 0814  LABPROT 15.2  INR 1.18   ABG  Basename 12/19/11 1139  PHART --  HCO3 18.2*    Studies/Results: No results found.  Anti-infectives: Anti-infectives     Start     Dose/Rate Route Frequency Ordered Stop   12/19/11 1600   ertapenem (INVANZ) 1 g in sodium chloride 0.9 % 50 mL IVPB        1 g 100 mL/hr over 30 Minutes Intravenous Every 24 hours 12/19/11 1554     12/19/11 1115   ertapenem (INVANZ) 1 g in sodium chloride 0.9 % 50 mL IVPB  Status:  Discontinued        1 g 100 mL/hr over 30 Minutes Intravenous To Surgery 12/19/11 1104 12/19/11 1537   12/18/11 1413   vancomycin (VANCOCIN) 1,500 mg in sodium chloride 0.9 % 500 mL IVPB        1,500 mg 250 mL/hr over 120 Minutes Intravenous 120 min pre-op 12/18/11 1413  12/19/11 1019   12/18/11 1413   vancomycin (VANCOCIN) IVPB 1000 mg/200 mL premix  Status:  Discontinued        1,000 mg 200 mL/hr over 60 Minutes Intravenous 120 min pre-op 12/18/11 1413 12/18/11 1413          Assessment/Plan: s/p Procedure(s) (LRB): SPLENECTOMY (N/A) Continue ABX therapy due to Post-op infection D/C NGT OOB D/C foley Wound vac change Saturday Hyponatremia adjust IVF START lovanox  LOS: 1 day    Raushanah Osmundson A. 12/20/2011

## 2011-12-20 NOTE — Progress Notes (Signed)
Inpatient Diabetes Program Recommendations  AACE/ADA: New Consensus Statement on Inpatient Glycemic Control (2009)  Target Ranges:  Prepandial:   less than 140 mg/dL      Peak postprandial:   less than 180 mg/dL (1-2 hours)      Critically ill patients:  140 - 180 mg/dL   Reason for Visit: Results for Kristy Stewart, Kristy Stewart (MRN [id number]) as of 12/20/2011 12:12  Ref. Range 12/19/2011 23:58 12/20/2011 04:15 12/20/2011 04:24 12/20/2011 08:11 12/20/2011 11:36  Glucose-Capillary Latest Range: 70-99 mg/dL 782 (H)  956 (H) 213 (H) 231 (H)    Inpatient Diabetes Program Recommendations Insulin - Basal: Consider adding Lantus 18 units daily. HgbA1C: Note A1C= 8.6% indicating suboptimal glycemic control prior to admit.    Note: Will follow.

## 2011-12-21 LAB — COMPREHENSIVE METABOLIC PANEL
Albumin: 2.1 g/dL — ABNORMAL LOW (ref 3.5–5.2)
Alkaline Phosphatase: 120 U/L — ABNORMAL HIGH (ref 39–117)
BUN: 7 mg/dL (ref 6–23)
CO2: 23 mEq/L (ref 19–32)
Chloride: 96 mEq/L (ref 96–112)
GFR calc Af Amer: >90 mL/min (ref >90)
Glucose, Bld: 198 mg/dL — ABNORMAL HIGH (ref 70–99)
Potassium: 4.2 mEq/L (ref 3.5–5.1)
Total Bilirubin: 0.2 mg/dL — ABNORMAL LOW (ref 0.3–1.2)

## 2011-12-21 LAB — CBC
HCT: 28 % — ABNORMAL LOW (ref 36.0–46.0)
Hemoglobin: 9.2 g/dL — ABNORMAL LOW (ref 12.0–15.0)
RBC: 3.54 MIL/uL — ABNORMAL LOW (ref 3.87–5.11)
WBC: 26.9 10*3/uL — ABNORMAL HIGH (ref 4.0–10.5)

## 2011-12-21 LAB — GLUCOSE, CAPILLARY: Glucose-Capillary: 216 mg/dL — ABNORMAL HIGH (ref 70–99)

## 2011-12-21 MED ORDER — ENOXAPARIN SODIUM 100 MG/ML ~~LOC~~ SOLN
100.0000 mg | Freq: Two times a day (BID) | SUBCUTANEOUS | Status: DC
  Administered 2011-12-22 – 2011-12-25 (×6): 100 mg via SUBCUTANEOUS
  Filled 2011-12-21 (×10): qty 1

## 2011-12-21 MED ORDER — ENOXAPARIN SODIUM 100 MG/ML ~~LOC~~ SOLN
100.0000 mg | Freq: Once | SUBCUTANEOUS | Status: AC
  Administered 2011-12-21: 100 mg via SUBCUTANEOUS
  Filled 2011-12-21: qty 1

## 2011-12-21 MED FILL — Heparin Sodium (Porcine) Inj 1000 Unit/ML: INTRAMUSCULAR | Qty: 30 | Status: AC

## 2011-12-21 MED FILL — Sodium Chloride IV Soln 0.9%: INTRAVENOUS | Qty: 1000 | Status: AC

## 2011-12-21 MED FILL — Sodium Chloride Irrigation Soln 0.9%: Qty: 3000 | Status: AC

## 2011-12-21 NOTE — Progress Notes (Signed)
ANTICOAGULATION CONSULT NOTE - Initial Consult  Pharmacy Consult for Lovenox Indication: bridge therapy for hx mesenteric, renal, and splenic artery clots  Allergies  Allergen Reactions  . Codeine Other (See Comments)    Violently ill; reports having taken Vicodin previously without problems  . Erythromycin Nausea And Vomiting  . Lactose Intolerance (Gi) Other (See Comments)    GI upset  . Penicillins Nausea Only    Patient Measurements: Height: 5\' 7"  (170.2 cm) Weight: 214 lb 15.2 oz (97.5 kg) IBW/kg (Calculated) : 61.6   Vital Signs: Temp: 98.7 F (37.1 C) (05/03 0819) Temp src: Oral (05/03 0819) BP: 105/58 mmHg (05/03 0819) Pulse Rate: 115  (05/03 0819)  Labs:  Basename 12/21/11 0440 12/20/11 0415 12/19/11 1606 12/19/11 0814  HGB 9.2* 10.3* -- --  HCT 28.0* 31.2* 31.4* --  PLT 518* 528* 525* --  APTT -- -- -- 38*  LABPROT -- -- -- 15.2  INR -- -- -- 1.18  HEPARINUNFRC -- -- -- --  CREATININE 0.67 0.69 0.87 --  CKTOTAL -- -- -- --  CKMB -- -- -- --  TROPONINI -- -- -- --   Estimated Creatinine Clearance: 97.6 ml/min (by C-G formula based on Cr of 0.67).  Medical History: Past Medical History  Diagnosis Date  . Arthritis   . Allergy   . GERD (gastroesophageal reflux disease)   . Anxiety   . Depression   . Hypertension   . 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 History of blood clots 08/2011    spleen and kidney; "from ulcer on my aorta"  . Bronchitis 08/2011  . H/O hiatal hernia   . Migraines     "gone since starting BP meds 2007"  . 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 Diabetes mellitus     Type 2 NIDDM x 3 years  . Splenic cyst   . Aortic thrombus 2013    Medications:  Scheduled:    . acetaminophen  1,000 mg Intravenous Q6H  . antiseptic oral rinse  15 mL Mouth Rinse BID  . enoxaparin  40 mg Subcutaneous Q24H  . ertapenem (INVANZ) IV  1 g Intravenous Q24H  . haemophilus B  polysaccharide conjugate vaccine  0.5 mL Intramuscular Once  . HYDROmorphone PCA 0.3 mg/mL   Intravenous Q4H  . insulin aspart  0-15 Units Subcutaneous Q4H  . meningococcal vaccine  0.5 mL Subcutaneous Once  . pneumococcal 23 valent vaccine  0.5 mL Intramuscular Tomorrow-1000  . sodium chloride  1,000 mL Intravenous Once  . vancomycin  1,000 mg Intravenous Q8H  . DISCONTD: haemophilus B conjugate vaccine  0.5 mL Intramuscular Once  . DISCONTD: haemophilus B polysaccharide conjugate vaccine  0.5 mL Intramuscular Once    Assessment: 56 yo F post-op day#2 from splenectomy to begin Lovenox for hx clot while Coumadin on hold.  Pt received Lovenox 40mg  SQ ~ 0800 this AM.  Of note, pt also on Invanz and Vancomycin for empiric post-op infection coverage.  AFeb but WBC elevated.  Renal function remains stable with CrCl ~ 100.  Goal of Therapy:  Anti-Xa level 0.6-1.2 4 hours after Lovenox dose given   Plan:  Lovenox 100 mg SQ Q12h.  Will delay dose to 4 pm this afternoon with 40 mg dose given this AM.  Then start on Q12h schedule at 0600 and 1800 times.  Patient on q8h Vancomycin regimen.  Will order Vancomycin trough with AM labs.  [employer], Pharm.D., BCPS Clinical Pharmacist Pager [phone] 12/21/2011 10:50 AM

## 2011-12-21 NOTE — Evaluation (Signed)
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 She presents with acute pain, decreased activity tolerance, decreased cardiorespiratory status and generalized weakness all limiting independence with mobility.  She will benefit from skilled PT to maximzie independence with mobility and allow return home with 24 hour assist of friends, co-workers and neighbors..    PT Assessment  Patient needs continued PT services    Follow Up Recommendations  Home health PT;Supervision/Assistance - 24 hour    Equipment Recommendations  Rolling walker with 5" wheels    Frequency Min 3X/week    Precautions / Restrictions Precautions Precautions: Fall Precaution Comments: wound vac, wound drain   Pertinent Vitals/Pain 5/10 with mobility on PCA worse at times with bed mobility      Mobility  Bed Mobility Bed Mobility: Rolling Right;Rolling Left;Supine to Sit;Sitting - Scoot to [place] of Bed Rolling Right: 4: Min assist Rolling Left: 3: Mod assist Supine to Sit: 3: Mod assist Sitting - Scoot to Edge of Bed: 4: Min assist Details for Bed Mobility Assistance: attempted sup to side to sit to both directions, but too painful with abdominal wound laterally facing on left, also encroaches upon breathing due to h/o enlarged spleen decreasing diaphragm excursion per patient; so assisted to pull her up from supine Transfers Transfers: Sit to Stand;Stand to Sit Sit to Stand: 4: Min assist;From bed Stand to Sit: 4: Min assist;To bed Ambulation/Gait Ambulation/Gait Assistance: 3: Mod assist Ambulation Distance (Feet): 7 Feet (forward, then backward) Assistive device: Rolling walker Ambulation/Gait Assistance Details: cues for decreased proximity to walker and  increased upright posture.  Had to step back to bed due to desaturation with ambulation (down to 71 on monitor) Gait Pattern: Step-to pattern    Exercises     PT Goals Acute Rehab PT Goals PT Goal Formulation: With patient Time For Goal Achievement: 01/04/12 Potential to Achieve Goals: Good Pt will go Supine/Side to Sit: with modified independence PT Goal: Supine/Side to Sit - Progress: Goal set today Pt will go Sit to Supine/Side: with modified independence PT Goal: Sit to Supine/Side - Progress: Goal set today Pt will go Sit to Stand: with modified independence PT Goal: Sit to Stand - Progress: Goal set today Pt will Ambulate: with least restrictive assistive device;with supervision;51 - 150 feet PT Goal: Ambulate - Progress: Goal set today  Visit Information  Last PT Received On: 12/21/11 Assistance Needed: +2 (for equipment)    Subjective Data  Subjective: Was up earlier to chair and sat up to drink coffee. Patient Stated Goal: To return home with friends to assist.   Prior Functioning  Home Living Lives With: Alone Available Help at Discharge: Friend(s);Neighbor;Available 24 hours/day Type of Home: House Prior Function Level of Independence: Independent Comments: Reports was having difficulty taking deep breaths for several months Communication Communication: No difficulties    Cognition  Overall Cognitive Status: Appears within functional limits for tasks assessed/performed Arousal/Alertness: Awake/alert Behavior During Session: [facility] for tasks performed    Extremity/Trunk Assessment Right Lower Extremity Assessment RLE ROM/Strength/Tone: Deficits;Due to pain RLE ROM/Strength/Tone Deficits: limited hip flexion with abdominal wound, otherwise grossly WFL Left Lower Extremity Assessment LLE ROM/Strength/Tone: Deficits;Due to pain LLE ROM/Strength/Tone Deficits: limited hip flexion with abdominal wound, otherwise grossly [facility] Trunk Assessment Trunk Assessment:  Kyphotic   Balance    End  of Session PT - End of Session Activity Tolerance: Patient limited by fatigue;Treatment limited secondary to medical complications (Comment) (decreased O2 sats) Nurse Communication: Other (comment) (oxygen desaturation with activity)   Gabbi Whetstone,CYNDI 12/21/2011, 4:46 PM

## 2011-12-21 NOTE — Progress Notes (Signed)
2 Days Post-Op  Subjective: Tied,  Sore feels bad.  Objective: Vital signs in last 24 hours: Temp:  [97.7 F (36.5 C)-99.2 F (37.3 C)] 98.7 F (37.1 C) (05/03 0819) Pulse Rate:  [95-115] 115  (05/03 0819) Resp:  [15-22] 20  (05/03 0844) BP: (86-125)/(44-64) 105/58 mmHg (05/03 0819) SpO2:  [91 %-97 %] 91 % (05/03 0844) Last BM Date: 12/19/11  Intake/Output from previous day: 05/02 0701 - 05/03 0700 In: 4187.5 [I.V.:2437.5; IV Piggyback:1750] Out: 2070 [Urine:2000; Drains:70] Intake/Output this shift: Total I/O In: -  Out: 245 [Urine:225; Drains:20]  Incision/Wound:vac in place.  Soft abdomen.  JP serous  Lab Results:   Basename 12/21/11 0440 12/20/11 0415  WBC 26.9* 17.9*  HGB 9.2* 10.3*  HCT 28.0* 31.2*  PLT 518* 528*   BMET  Basename 12/21/11 0440 12/20/11 0415  NA 126* 126*  K 4.2 4.9  CL 96 95*  CO2 23 22  GLUCOSE 198* 215*  BUN 7 12  CREATININE 0.67 0.69  CALCIUM 8.3* 8.2*   PT/INR  Basename 12/19/11 0814  LABPROT 15.2  INR 1.18   ABG  Basename 12/19/11 1139  PHART --  HCO3 18.2*    Studies/Results: No results found.  Anti-infectives: Anti-infectives     Start     Dose/Rate Route Frequency Ordered Stop   12/20/11 0730   vancomycin (VANCOCIN) IVPB 1000 mg/200 mL premix        1,000 mg 200 mL/hr over 60 Minutes Intravenous 3 times per day 12/20/11 0720     12/20/11 0715   vancomycin (VANCOCIN) IVPB 1000 mg/200 mL premix  Status:  Discontinued     Comments: Pharmacy to adjust dose as needed.      1,000 mg 200 mL/hr over 60 Minutes Intravenous Every 12 hours 12/20/11 0704 12/20/11 0713   12/19/11 1600   ertapenem (INVANZ) 1 g in sodium chloride 0.9 % 50 mL IVPB        1 g 100 mL/hr over 30 Minutes Intravenous Every 24 hours 12/19/11 1554     12/19/11 1115   ertapenem (INVANZ) 1 g in sodium chloride 0.9 % 50 mL IVPB  Status:  Discontinued        1 g 100 mL/hr over 30 Minutes Intravenous To Surgery 12/19/11 1104 12/19/11 1537   12/18/11 1413   vancomycin (VANCOCIN) 1,500 mg in sodium chloride 0.9 % 500 mL IVPB        1,500 mg 250 mL/hr over 120 Minutes Intravenous 120 min pre-op 12/18/11 1413 12/19/11 1019   12/18/11 1413   vancomycin (VANCOCIN) IVPB 1000 mg/200 mL premix  Status:  Discontinued        1,000 mg 200 mL/hr over 60 Minutes Intravenous 120 min pre-op 12/18/11 1413 12/18/11 1413          Assessment/Plan: s/p Procedure(s) (LRB): SPLENECTOMY (N/A) for splenic abscess. History of emolic disease. PT/OT Will put on heparin gtt until PO better and can restart coumadin.  High risk of embolic issues and DVT given post op state and immobility.  Clear liquid diet for now.   Leukocytosis secondary to splenectomy and infection.  Keep on antibiotics and change once cultures final. Keep in stepdown.  Foley put in due to urinary retension.  Pt needs strict I and O s Vac change Saturday..  LOS: 2 days    Prashant Glosser A. 12/21/2011

## 2011-12-21 NOTE — Progress Notes (Signed)
Pt declines wearing oxygen via nasal cannula; O2 sats in the range 88-90; will continue to monitor

## 2011-12-21 NOTE — Progress Notes (Signed)
12/21/11 0210  MD called for pt low urine output. Pt bladder scanned and it showed 550cc. MD on call states to place a new foley instead of in and out cath.   Elisha Headland RN

## 2011-12-22 LAB — COMPREHENSIVE METABOLIC PANEL
Albumin: 2 g/dL — ABNORMAL LOW (ref 3.5–5.2)
Alkaline Phosphatase: 161 U/L — ABNORMAL HIGH (ref 39–117)
BUN: 7 mg/dL (ref 6–23)
CO2: 23 mEq/L (ref 19–32)
Chloride: 97 mEq/L (ref 96–112)
Glucose, Bld: 163 mg/dL — ABNORMAL HIGH (ref 70–99)
Potassium: 3.9 mEq/L (ref 3.5–5.1)
Total Bilirubin: 0.2 mg/dL — ABNORMAL LOW (ref 0.3–1.2)

## 2011-12-22 LAB — CBC
HCT: 26.3 % — ABNORMAL LOW (ref 36.0–46.0)
Hemoglobin: 8.6 g/dL — ABNORMAL LOW (ref 12.0–15.0)
RBC: 3.27 MIL/uL — ABNORMAL LOW (ref 3.87–5.11)
RDW: 16.1 % — ABNORMAL HIGH (ref 11.5–15.5)
WBC: 20.7 10*3/uL — ABNORMAL HIGH (ref 4.0–10.5)

## 2011-12-22 LAB — GLUCOSE, CAPILLARY
Glucose-Capillary: 115 mg/dL — ABNORMAL HIGH (ref 70–99)
Glucose-Capillary: 148 mg/dL — ABNORMAL HIGH (ref 70–99)

## 2011-12-22 LAB — CULTURE, ROUTINE-ABSCESS: Culture: NO GROWTH

## 2011-12-22 LAB — VANCOMYCIN, TROUGH: Vancomycin Tr: 24.2 ug/mL — ABNORMAL HIGH (ref 10.0–20.0)

## 2011-12-22 MED ORDER — BUPROPION HCL 75 MG PO TABS
75.0000 mg | ORAL_TABLET | Freq: Two times a day (BID) | ORAL | Status: DC
  Administered 2011-12-22 – 2011-12-25 (×7): 75 mg via ORAL
  Filled 2011-12-22 (×9): qty 1

## 2011-12-22 MED ORDER — VANCOMYCIN HCL 1000 MG IV SOLR
1250.0000 mg | Freq: Two times a day (BID) | INTRAVENOUS | Status: DC
  Administered 2011-12-22 – 2011-12-24 (×5): 1250 mg via INTRAVENOUS
  Filled 2011-12-22 (×6): qty 1250

## 2011-12-22 MED ORDER — HYDROMORPHONE HCL PF 1 MG/ML IJ SOLN
0.5000 mg | Freq: Once | INTRAMUSCULAR | Status: AC
  Administered 2011-12-22: 0.5 mg via INTRAVENOUS

## 2011-12-22 MED ORDER — BISACODYL 10 MG RE SUPP
10.0000 mg | Freq: Once | RECTAL | Status: AC
  Administered 2011-12-22: 10 mg via RECTAL
  Filled 2011-12-22: qty 1

## 2011-12-22 MED ORDER — VENLAFAXINE HCL ER 75 MG PO CP24
75.0000 mg | ORAL_CAPSULE | Freq: Every evening | ORAL | Status: DC
  Administered 2011-12-22 – 2011-12-24 (×3): 75 mg via ORAL
  Filled 2011-12-22 (×4): qty 1

## 2011-12-22 MED ORDER — ALPRAZOLAM 0.25 MG PO TABS
0.2500 mg | ORAL_TABLET | Freq: Two times a day (BID) | ORAL | Status: DC | PRN
  Administered 2011-12-23 – 2011-12-25 (×3): 0.25 mg via ORAL
  Filled 2011-12-22 (×3): qty 1

## 2011-12-22 MED ORDER — TRAZODONE HCL 100 MG PO TABS
100.0000 mg | ORAL_TABLET | Freq: Every day | ORAL | Status: DC
  Administered 2011-12-22 – 2011-12-24 (×3): 100 mg via ORAL
  Filled 2011-12-22 (×4): qty 1

## 2011-12-22 NOTE — Progress Notes (Signed)
ANTICOAGULATION CONSULT NOTE - follow up   Pharmacy Consult for Lovenox / vancomycin Indication: bridge therapy for hx mesenteric, renal, and splenic artery clots / abcess s/p splenectomy  Allergies  Allergen Reactions  . Codeine Other (See Comments)    Violently ill; reports having taken Vicodin previously without problems  . Erythromycin Nausea And Vomiting  . Lactose Intolerance (Gi) Other (See Comments)    GI upset  . Penicillins Nausea Only    Patient Measurements: Height: 5\' 7"  (170.2 cm) Weight: 214 lb 15.2 oz (97.5 kg) IBW/kg (Calculated) : 61.6   Vital Signs: Temp: 98 F (36.7 C) (05/04 1226) Temp src: Oral (05/04 1226) BP: 116/61 mmHg (05/04 1226) Pulse Rate: 94  (05/04 1226)  Labs:  Basename 12/22/11 0430 12/21/11 0440 12/20/11 0415  HGB 8.6* 9.2* --  HCT 26.3* 28.0* 31.2*  PLT 584* 518* 528*  APTT -- -- --  LABPROT -- -- --  INR -- -- --  HEPARINUNFRC -- -- --  CREATININE 0.82 0.67 0.69  CKTOTAL -- -- --  CKMB -- -- --  TROPONINI -- -- --   Estimated Creatinine Clearance: 95.2 ml/min (by C-G formula based on Cr of 0.82).  Medical History: Past Medical History  Diagnosis Date  . Arthritis   . Allergy   . GERD (gastroesophageal reflux disease)   . Anxiety   . Depression   . Hypertension   . 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 History of blood clots 08/2011    spleen and kidney; "from ulcer on my aorta"  . Bronchitis 08/2011  . H/O hiatal hernia   . Migraines     "gone since starting BP meds 2007"  . 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 Diabetes mellitus     Type 2 NIDDM x 3 years  . Splenic cyst   . Aortic thrombus 2013    Medications:  Scheduled:     . antiseptic oral rinse  15 mL Mouth Rinse BID  . bisacodyl  10 mg Rectal Once  . buPROPion  75 mg Oral BID  . enoxaparin (LOVENOX) injection  100 mg Subcutaneous Once  . enoxaparin (LOVENOX) injection  100 mg  Subcutaneous Q12H  . ertapenem (INVANZ) IV  1 g Intravenous Q24H  .  HYDROmorphone (DILAUDID) injection  0.5 mg Intravenous Once  . HYDROmorphone PCA 0.3 mg/mL   Intravenous Q4H  . insulin aspart  0-15 Units Subcutaneous Q4H  . traZODone  100 mg Oral QHS  . vancomycin  1,250 mg Intravenous Q12H  . venlafaxine XR  75 mg Oral QPM  . DISCONTD: vancomycin  1,000 mg Intravenous Q8H    Assessment: 56 yo F post-op day#3 from splenectomy on Lovenox treatment dose 1mg /kg q12 for hx clot. Coumadin on hold for OR.  Pt received Lovenox 40mg  SQ ~ 0800 this AM.  Of note, pt also on Invanz and Vancomycin for empiric post-op infection coverage.  AFeb but WBC elevated. Vancomycin dose adjusted for age/rena fx will draw trough this week if it continues.  Renal function remains stable with CrCl ~ 100.  Goal of Therapy:  Anti-Xa level 0.6-1.2 4 hours after Lovenox dose given Vancomycin trough 15-20   Plan:  Lovenox 100 mg SQ Q12h.   F/u when ok to restart Coumadin Vancomycin 1250mg  iv q12  Leota Sauers Pharm.D. CPP, BCPS Clinical Pharmacist [phone] 12/22/2011 2:21 PM

## 2011-12-22 NOTE — Progress Notes (Signed)
ANTIBIOTIC CONSULT NOTE   Pharmacy Consult for vancomcyin Indication: splenic abscess s/p splenectomy  Allergies  Allergen Reactions  . Codeine Other (See Comments)    Violently ill; reports having taken Vicodin previously without problems  . Erythromycin Nausea And Vomiting  . Lactose Intolerance (Gi) Other (See Comments)    GI upset  . Penicillins Nausea Only    Patient Measurements: Height: 5\' 7"  (170.2 cm) Weight: 214 lb 15.2 oz (97.5 kg) IBW/kg (Calculated) : 61.6   Vital Signs: Temp: 98.8 F (37.1 C) (05/04 0000) Temp src: Oral (05/04 0000) BP: 121/60 mmHg (05/04 0000) Pulse Rate: 93  (05/04 0329)  Labs:  Basename 12/22/11 0430 12/21/11 0440 12/20/11 0415 12/19/11 1606  WBC 20.7* 26.9* 17.9* --  HGB 8.6* 9.2* 10.3* --  PLT 584* 518* 528* --  LABCREA -- -- -- --  CREATININE -- 0.67 0.69 0.87   Estimated Creatinine Clearance: 97.6 ml/min (by C-G formula based on Cr of 0.67).  Vancomycin trough 24.2  Assessment: 56yo female with large abscess of spleen, now s/p splenectomy for empiric vancomycin  Goal of Therapy:  Vancomycin trough level 15-20 mcg/ml  Plan:  Change vancomycin 1250 mg IV q12h  Eddie Candle 12/22/2011,5:46 AM

## 2011-12-22 NOTE — Progress Notes (Signed)
Pt c/o urgency to urinate, pt currently has foley, bladder scan done pt noted to have 50 ml in bladder at that time, pt to [facility] for possible relief, pt passed gas and urgency stopped, RN will cont to monitor

## 2011-12-22 NOTE — Progress Notes (Signed)
3 Days Post-Op  Subjective: No major issues overnight. Nurse reports pt had some confusion overnight. C/o soreness in Left lat abdominal wall. No flatus. No bm. No n/v. Tolerating liquids  Objective: Vital signs in last 24 hours: Temp:  [98.4 F (36.9 C)-100 F (37.8 C)] 98.4 F (36.9 C) (05/04 0804) Pulse Rate:  [93-117] 97  (05/04 0804) Resp:  [19-22] 21  (05/04 0815) BP: (103-130)/(55-65) 103/65 mmHg (05/04 0804) SpO2:  [71 %-99 %] 99 % (05/04 0815) Last BM Date: 12/19/11  Intake/Output from previous day: 05/03 0701 - 05/04 0700 In: 2500 [I.V.:2500] Out: 4025 [Urine:3925; Drains:100] Intake/Output this shift: Total I/O In: -  Out: 1000 [Urine:1000]  Alert, ox3 cta Reg Obese, soft, expected mild ttp. Drain-serosang. Wound - base is c/d/i. No cellulitis. Viable tissue  Lab Results:   Basename 12/22/11 0430 12/21/11 0440  WBC 20.7* 26.9*  HGB 8.6* 9.2*  HCT 26.3* 28.0*  PLT 584* 518*   BMET  Basename 12/22/11 0430 12/21/11 0440  NA 132* 126*  K 3.9 4.2  CL 97 96  CO2 23 23  GLUCOSE 163* 198*  BUN 7 7  CREATININE 0.82 0.67  CALCIUM 8.6 8.3*   PT/INR No results found for this basename: LABPROT:2,INR:2 in the last 72 hours ABG  Basename 12/19/11 1139  PHART --  HCO3 18.2*    Studies/Results: No results found.  Anti-infectives: Anti-infectives     Start     Dose/Rate Route Frequency Ordered Stop   12/22/11 0800   vancomycin (VANCOCIN) 1,250 mg in sodium chloride 0.9 % 250 mL IVPB        1,250 mg 166.7 mL/hr over 90 Minutes Intravenous Every 12 hours 12/22/11 0549     12/20/11 0730   vancomycin (VANCOCIN) IVPB 1000 mg/200 mL premix  Status:  Discontinued        1,000 mg 200 mL/hr over 60 Minutes Intravenous 3 times per day 12/20/11 0720 12/22/11 0549   12/20/11 0715   vancomycin (VANCOCIN) IVPB 1000 mg/200 mL premix  Status:  Discontinued     Comments: Pharmacy to adjust dose as needed.      1,000 mg 200 mL/hr over 60 Minutes Intravenous Every  12 hours 12/20/11 0704 12/20/11 0713   12/19/11 1600   ertapenem (INVANZ) 1 g in sodium chloride 0.9 % 50 mL IVPB        1 g 100 mL/hr over 30 Minutes Intravenous Every 24 hours 12/19/11 1554     12/19/11 1115   ertapenem (INVANZ) 1 g in sodium chloride 0.9 % 50 mL IVPB  Status:  Discontinued        1 g 100 mL/hr over 30 Minutes Intravenous To Surgery 12/19/11 1104 12/19/11 1537   12/18/11 1413   vancomycin (VANCOCIN) 1,500 mg in sodium chloride 0.9 % 500 mL IVPB        1,500 mg 250 mL/hr over 120 Minutes Intravenous 120 min pre-op 12/18/11 1413 12/19/11 1019   12/18/11 1413   vancomycin (VANCOCIN) IVPB 1000 mg/200 mL premix  Status:  Discontinued        1,000 mg 200 mL/hr over 60 Minutes Intravenous 120 min pre-op 12/18/11 1413 12/18/11 1413          Assessment/Plan: s/p Procedure(s) (LRB): SPLENECTOMY (N/A)  Stay on clears today Will give dulcolax Wound vac changed today - as expected, first [place] change a little rough despite premed; next change on Tues Cont therapeutic lovenox secondary to h/o blood clots Cont IV abx secondary intra-abd pre-existing  infection; f/u cultures ABL anemia - repeat CBC in am Urinary retention- cont foley today Psych- reordered pt's home meds  tx to floor in am  Mary Sella. Andrey Campanile, MD, FACS General, Bariatric, & Minimally Invasive Surgery [facility], [place]   LOS: 3 days    Atilano Ina 12/22/2011

## 2011-12-23 LAB — BASIC METABOLIC PANEL
CO2: 22 mEq/L (ref 19–32)
Chloride: 99 mEq/L (ref 96–112)
GFR calc Af Amer: >90 mL/min (ref >90)
Sodium: 132 mEq/L — ABNORMAL LOW (ref 135–145)

## 2011-12-23 LAB — CBC
HCT: 25.9 % — ABNORMAL LOW (ref 36.0–46.0)
MCV: 79.7 fL (ref 78.0–100.0)
Platelets: 656 10*3/uL — ABNORMAL HIGH (ref 150–400)
RBC: 3.25 MIL/uL — ABNORMAL LOW (ref 3.87–5.11)
WBC: 15.8 10*3/uL — ABNORMAL HIGH (ref 4.0–10.5)

## 2011-12-23 LAB — TYPE AND SCREEN
Antibody Screen: NEGATIVE
Unit division: 0

## 2011-12-23 LAB — DIFFERENTIAL
Basophils Absolute: 0 10*3/uL (ref 0.0–0.1)
Eosinophils Relative: 4 % (ref 0–5)
Lymphocytes Relative: 13 % (ref 12–46)
Lymphs Abs: 2.1 10*3/uL (ref 0.7–4.0)
Monocytes Absolute: 1 10*3/uL (ref 0.1–1.0)
Neutro Abs: 12 10*3/uL — ABNORMAL HIGH (ref 1.7–7.7)

## 2011-12-23 LAB — GLUCOSE, CAPILLARY
Glucose-Capillary: 160 mg/dL — ABNORMAL HIGH (ref 70–99)
Glucose-Capillary: 113 mg/dL — ABNORMAL HIGH (ref 70–99)

## 2011-12-23 MED ORDER — HYDROMORPHONE HCL PF 1 MG/ML IJ SOLN: 0.5000 mg | INTRAMUSCULAR | Status: DC | PRN

## 2011-12-23 MED ORDER — HYDROMORPHONE HCL 2 MG PO TABS
2.0000 mg | ORAL_TABLET | ORAL | Status: DC | PRN
  Administered 2011-12-23 – 2011-12-25 (×11): 2 mg via ORAL
  Filled 2011-12-23 (×11): qty 1

## 2011-12-23 MED ORDER — INSULIN ASPART 100 UNIT/ML ~~LOC~~ SOLN
0.0000 [IU] | Freq: Three times a day (TID) | SUBCUTANEOUS | Status: DC
  Administered 2011-12-23: 2 [IU] via SUBCUTANEOUS
  Administered 2011-12-23 – 2011-12-24 (×2): 3 [IU] via SUBCUTANEOUS
  Administered 2011-12-24 – 2011-12-25 (×2): 2 [IU] via SUBCUTANEOUS

## 2011-12-23 NOTE — Progress Notes (Signed)
4 Days Post-Op  Subjective: Tolerating liquids, +flatus and BM  Objective: Vital signs in last 24 hours: Temp:  [98 F (36.7 C)-98.2 F (36.8 C)] 98.1 F (36.7 C) (05/05 0726) Pulse Rate:  [91-97] 95  (05/05 0726) Resp:  [15-22] 20  (05/05 0820) BP: (97-123)/(61-68) 123/68 mmHg (05/05 0726) SpO2:  [92 %-100 %] 97 % (05/05 0820) Last BM Date: 12/19/11  Intake/Output from previous day: 05/04 0701 - 05/05 0700 In: 4240 [P.O.:1840; I.V.:1900; IV Piggyback:500] Out: 4866 [Urine:4800; Drains:65; Stool:1] Intake/Output this shift: Total I/O In: 715 [P.O.:640; I.V.:75] Out: -   General appearance: alert, cooperative and no distress Resp: clear to auscultation bilaterally Cardio: mild tachycardia HR 102, regular GI: incisional tenderness, ND, active BS. JP with ss output and some purulence as well, no peritoneal signs  Lab Results:  @LABLAST2 (wbc:2,hgb:2,hct:2,plt:2) BMET  Basename 12/23/11 0330 12/22/11 0430  NA 132* 132*  K 3.7 3.9  CL 99 97  CO2 22 23  GLUCOSE 148* 163*  BUN 6 7  CREATININE 0.82 0.82  CALCIUM 8.4 8.6   PT/INR No results found for this basename: LABPROT:2,INR:2 in the last 72 hours ABG No results found for this basename: PHART:2,PCO2:2,PO2:2,HCO3:2 in the last 72 hours  Studies/Results: No results found.  Anti-infectives: Anti-infectives     Start     Dose/Rate Route Frequency Ordered Stop   12/22/11 0800   vancomycin (VANCOCIN) 1,250 mg in sodium chloride 0.9 % 250 mL IVPB        1,250 mg 166.7 mL/hr over 90 Minutes Intravenous Every 12 hours 12/22/11 0549     12/20/11 0730   vancomycin (VANCOCIN) IVPB 1000 mg/200 mL premix  Status:  Discontinued        1,000 mg 200 mL/hr over 60 Minutes Intravenous 3 times per day 12/20/11 0720 12/22/11 0549   12/20/11 0715   vancomycin (VANCOCIN) IVPB 1000 mg/200 mL premix  Status:  Discontinued     Comments: Pharmacy to adjust dose as needed.      1,000 mg 200 mL/hr over 60 Minutes Intravenous Every 12  hours 12/20/11 0704 12/20/11 0713   12/19/11 1600   ertapenem (INVANZ) 1 g in sodium chloride 0.9 % 50 mL IVPB        1 g 100 mL/hr over 30 Minutes Intravenous Every 24 hours 12/19/11 1554     12/19/11 1115   ertapenem (INVANZ) 1 g in sodium chloride 0.9 % 50 mL IVPB  Status:  Discontinued        1 g 100 mL/hr over 30 Minutes Intravenous To Surgery 12/19/11 1104 12/19/11 1537   12/18/11 1413   vancomycin (VANCOCIN) 1,500 mg in sodium chloride 0.9 % 500 mL IVPB        1,500 mg 250 mL/hr over 120 Minutes Intravenous 120 min pre-op 12/18/11 1413 12/19/11 1019   12/18/11 1413   vancomycin (VANCOCIN) IVPB 1000 mg/200 mL premix  Status:  Discontinued        1,000 mg 200 mL/hr over 60 Minutes Intravenous 120 min pre-op 12/18/11 1413 12/18/11 1413          Assessment/Plan: s/p Procedure(s): SPLENECTOMY d/c foley, advance diet, and should be okay for transfer to floor   LOS: 4 days    Kristy Stewart 12/23/2011

## 2011-12-23 NOTE — Progress Notes (Signed)
Pt tx to 5100 per MD order, pt VSS, pt verbalized understanding of tx, report called to tx RN, all questions answered

## 2011-12-24 LAB — GLUCOSE, CAPILLARY: Glucose-Capillary: 99 mg/dL (ref 70–99)

## 2011-12-24 LAB — PROTIME-INR
INR: 1.46 (ref 0.00–1.49)
Prothrombin Time: 18 seconds — ABNORMAL HIGH (ref 11.6–15.2)

## 2011-12-24 MED ORDER — AMOXICILLIN-POT CLAVULANATE 875-125 MG PO TABS
1.0000 | ORAL_TABLET | Freq: Two times a day (BID) | ORAL | Status: DC
  Administered 2011-12-24 – 2011-12-25 (×3): 1 via ORAL
  Filled 2011-12-24 (×6): qty 1

## 2011-12-24 MED ORDER — WARFARIN SODIUM 10 MG PO TABS
10.0000 mg | ORAL_TABLET | Freq: Every day | ORAL | Status: DC
  Administered 2011-12-24: 10 mg via ORAL
  Filled 2011-12-24 (×3): qty 1

## 2011-12-24 MED ORDER — WARFARIN - PHYSICIAN DOSING INPATIENT: Freq: Every day | Status: DC

## 2011-12-24 NOTE — Progress Notes (Signed)
5 Days Post-Op  Subjective: Feeling better Objective: Vital signs in last 24 hours: Temp:  [97.8 F (36.6 C)-98.4 F (36.9 C)] 98.1 F (36.7 C) (05/06 0538) Pulse Rate:  [94-102] 102  (05/06 0538) Resp:  [18-19] 18  (05/06 0538) BP: (112-140)/(63-90) 129/76 mmHg (05/06 0538) SpO2:  [91 %-97 %] 92 % (05/06 0538) Last BM Date: 12/19/11  Intake/Output from previous day: 05/05 0701 - 05/06 0700 In: 715 [P.O.:640; I.V.:75] Out: 1240 [Urine:1200; Drains:40] Intake/Output this shift: Total I/O In: -  Out: 1600 [Urine:1600]  Incision/Wound:vac in place.  JP minimal  Lab Results:   Basename 12/23/11 0330 12/22/11 0430  WBC 15.8* 20.7*  HGB 8.3* 8.6*  HCT 25.9* 26.3*  PLT 656* 584*   BMET  Basename 12/23/11 0330 12/22/11 0430  NA 132* 132*  K 3.7 3.9  CL 99 97  CO2 22 23  GLUCOSE 148* 163*  BUN 6 7  CREATININE 0.82 0.82  CALCIUM 8.4 8.6   PT/INR No results found for this basename: LABPROT:2,INR:2 in the last 72 hours ABG No results found for this basename: PHART:2,PCO2:2,PO2:2,HCO3:2 in the last 72 hours  Studies/Results: No results found.  Anti-infectives: Anti-infectives     Start     Dose/Rate Route Frequency Ordered Stop   12/22/11 0800   vancomycin (VANCOCIN) 1,250 mg in sodium chloride 0.9 % 250 mL IVPB        1,250 mg 166.7 mL/hr over 90 Minutes Intravenous Every 12 hours 12/22/11 0549     12/20/11 0730   vancomycin (VANCOCIN) IVPB 1000 mg/200 mL premix  Status:  Discontinued        1,000 mg 200 mL/hr over 60 Minutes Intravenous 3 times per day 12/20/11 0720 12/22/11 0549   12/20/11 0715   vancomycin (VANCOCIN) IVPB 1000 mg/200 mL premix  Status:  Discontinued     Comments: Pharmacy to adjust dose as needed.      1,000 mg 200 mL/hr over 60 Minutes Intravenous Every 12 hours 12/20/11 0704 12/20/11 0713   12/19/11 1600   ertapenem (INVANZ) 1 g in sodium chloride 0.9 % 50 mL IVPB        1 g 100 mL/hr over 30 Minutes Intravenous Every 24 hours  12/19/11 1554     12/19/11 1115   ertapenem (INVANZ) 1 g in sodium chloride 0.9 % 50 mL IVPB  Status:  Discontinued        1 g 100 mL/hr over 30 Minutes Intravenous To Surgery 12/19/11 1104 12/19/11 1537   12/18/11 1413   vancomycin (VANCOCIN) 1,500 mg in sodium chloride 0.9 % 500 mL IVPB        1,500 mg 250 mL/hr over 120 Minutes Intravenous 120 min pre-op 12/18/11 1413 12/19/11 1019   12/18/11 1413   vancomycin (VANCOCIN) IVPB 1000 mg/200 mL premix  Status:  Discontinued        1,000 mg 200 mL/hr over 60 Minutes Intravenous 120 min pre-op 12/18/11 1413 12/18/11 1413          Assessment/Plan: s/p Procedure(s) (LRB): doing well D/C IV antibiotics SPLENECTOMY (N/A) Plan for discharge tomorrow  LOS: 5 days    Reg Bircher A. 12/24/2011

## 2011-12-24 NOTE — Progress Notes (Signed)
OT Screen  OT order received. Chart reviewed. Pt with no OT needs at this time. OT signing off. [facility], OTR/L  [phone] 12/24/2011

## 2011-12-24 NOTE — Progress Notes (Signed)
Physical Therapy Treatment Patient Details Name: Kristy Stewart MRN: [id number] DOB: Dec 03, 1955 Today's Date: 12/24/2011 Time: 4098-1191 PT Time Calculation (min): 16 min  PT Assessment / Plan / Recommendation Comments on Treatment Session  Primary complaint today is shakiness. As time allows with attempt back so as to progress ambulation.     Follow Up Recommendations  Home health PT;Supervision for mobility/OOB    Equipment Recommendations  Rolling walker with 5" wheels    Frequency     Plan Discharge plan remains appropriate;Frequency remains appropriate    Precautions / Restrictions Precautions Precautions: Fall       Mobility  Bed Mobility Supine to Sit: With rails;HOB elevated;4: Min guard (30 degrees) Details for Bed Mobility Assistance: attempted rolling to right and performing side->sit but pt with too much pain so holding onto my arm pt able to pull herself up with mingaurdA (my arm simulating a rail) Transfers Sit to Stand: 4: Min guard;With upper extremity assist;From bed Stand to Sit: 4: Min guard;With upper extremity assist;To chair/3-in-1 Details for Transfer Assistance: cues for safe technique specifically hand placement and good positioning within RW  Ambulation/Gait Ambulation/Gait Assistance: 4: Min guard Ambulation Distance (Feet): 4 Feet Assistive device: Rolling walker Ambulation/Gait Assistance Details: pt declined further ambulation today because she had not eaten her breakfast yet; cues for safe technique with RW as pt tends to allow feet outside safe limits  Gait Pattern: Trunk flexed    Exercises     PT Goals Acute Rehab PT Goals PT Goal: Supine/Side to Sit - Progress: Progressing toward goal PT Goal: Sit to Stand - Progress: Progressing toward goal PT Goal: Ambulate - Progress: Progressing toward goal  Visit Information  Last PT Received On: 12/24/11 Assistance Needed: +2 (equipment)    Subjective Data  Subjective: I haven't had time to  eat this morning because everyone keeps interrupting me.    Cognition  Overall Cognitive Status: Appears within functional limits for tasks assessed/performed Arousal/Alertness: Awake/alert Orientation Level: Appears intact for tasks assessed Behavior During Session: [facility] for tasks performed    Balance     End of Session PT - End of Session Equipment Utilized During Treatment: Gait belt Activity Tolerance: Patient limited by fatigue Patient left: in chair;with call bell/phone within reach    [facility] HELEN 12/24/2011, 9:37 AM

## 2011-12-24 NOTE — Progress Notes (Addendum)
Pt with orders for [facility] and a rolling walker. EPIC only showing a PO [address].  Pt's physical address is:  [address], [place], [place] [address]. Phone numbers for home and mobile in Epic are correct.   Pt reports no prior experience with HH in the past and wishes to use [facility] for services this time. Planned for d/c home tomorrow. No Face to Face form needed as pt has [employer].   Pt plans to ask family member if they have a rolling walker she can borrow.  Will let me know in am before d/c if she needs the walker or not.

## 2011-12-24 NOTE — Progress Notes (Signed)
Physical Therapy Treatment Patient Details Name: Kristy Stewart MRN: [id number] DOB: 07-23-1956 Today's Date: 12/24/2011 Time:  -     PT Assessment / Plan / Recommendation Comments on Treatment Session  Doing great. Likely no follow up PT needed. Will see once more for stair practice. Pt's neighbor has RW so does not need one for d/c.     Follow Up Recommendations  No PT follow up    Equipment Recommendations  None recommended by PT    Frequency     Plan Discharge plan needs to be updated;Frequency remains appropriate    Precautions / Restrictions   wound vac in her abdomen      Mobility  Bed Mobility Bed Mobility: Sit to Supine Supine to Sit: 6: Modified independent (Device/Increase time);HOB flat Sit to Supine: 6: Modified independent (Device/Increase time) Details for Bed Mobility Assistance: pt able to pull herself up with therapists hand Transfers Transfers: Sit to Stand;Stand to Sit Sit to Stand: 5: Supervision;With upper extremity assist;From bed;From chair/3-in-1;With armrests Stand to Sit: 5: Supervision;With upper extremity assist;To bed;To chair/3-in-1;With armrests Ambulation/Gait Ambulation/Gait Assistance: 5: Supervision Ambulation Distance (Feet): 200 Feet Assistive device: Rolling walker Ambulation/Gait Assistance Details: cues for safe technique with RW; mild bout of dizziness but passed with rest Gait Pattern: Step-through pattern;Trunk flexed Stairs: Yes Stairs Assistance: 4: Min guard Stairs Assistance Details (indicate cue type and reason): cues for safe techique; pt reports he knees feel "wobbly" Stair Management Technique: Two rails;Step to pattern;Forwards Number of Stairs: 2     Exercises     PT Goals Acute Rehab PT Goals PT Goal: Supine/Side to Sit - Progress: Met PT Goal: Sit to Supine/Side - Progress: Met PT Goal: Sit to Stand - Progress: Progressing toward goal PT Goal: Ambulate - Progress: Met Pt will Go Up / Down Stairs: 3-5  stairs;with least restrictive assistive device;with min assist PT Goal: Up/Down Stairs - Progress: Goal set today  Visit Information  Last PT Received On: 12/24/11 Assistance Needed: +1    Subjective Data  Subjective: I wish they would just take this out of my stomach.    Cognition  Overall Cognitive Status: Appears within functional limits for tasks assessed/performed Arousal/Alertness: Awake/alert Orientation Level: Appears intact for tasks assessed Behavior During Session: [facility] for tasks performed    Balance     End of Session PT - End of Session Equipment Utilized During Treatment: Gait belt Activity Tolerance: Patient tolerated treatment well Patient left: in bed;with call bell/phone within reach    [facility] HELEN 12/24/2011, 3:35 PM

## 2011-12-25 ENCOUNTER — Telehealth (INDEPENDENT_AMBULATORY_CARE_PROVIDER_SITE_OTHER): Payer: Self-pay | Admitting: Surgery

## 2011-12-25 MED ORDER — HAEMOPHILUS B POLYSAC CONJ VAC IM SOLR
0.5000 mL | Freq: Once | INTRAMUSCULAR | Status: AC
  Administered 2011-12-25: 0.5 mL via INTRAMUSCULAR
  Filled 2011-12-25: qty 0.5

## 2011-12-25 MED ORDER — HYDROMORPHONE HCL 2 MG PO TABS: 2.0000 mg | ORAL_TABLET | ORAL | Status: AC | PRN

## 2011-12-25 MED ORDER — MENINGOCOCCAL VAC A,C,Y,W-135 ~~LOC~~ INJ
0.5000 mL | INJECTION | Freq: Once | SUBCUTANEOUS | Status: AC
  Administered 2011-12-25: 0.5 mL via SUBCUTANEOUS
  Filled 2011-12-25: qty 0.5

## 2011-12-25 MED ORDER — PNEUMOCOCCAL VAC POLYVALENT 25 MCG/0.5ML IJ INJ: 0.5000 mL | INJECTION | Freq: Once | INTRAMUSCULAR | Status: DC

## 2011-12-25 MED ORDER — PNEUMOCOCCAL VAC POLYVALENT 25 MCG/0.5ML IJ INJ
0.5000 mL | INJECTION | Freq: Once | INTRAMUSCULAR | Status: AC
  Administered 2011-12-25: 0.5 mL via INTRAMUSCULAR
  Filled 2011-12-25: qty 0.5

## 2011-12-25 MED ORDER — ONDANSETRON HCL 4 MG PO TABS: 4.0000 mg | ORAL_TABLET | Freq: Four times a day (QID) | ORAL | Status: AC | PRN

## 2011-12-25 MED ORDER — AMOXICILLIN-POT CLAVULANATE 875-125 MG PO TABS: 1.0000 | ORAL_TABLET | Freq: Two times a day (BID) | ORAL | Status: AC

## 2011-12-25 NOTE — Discharge Summary (Signed)
Physician Discharge Summary  Patient ID: Kristy Stewart MRN: [id number] DOB/AGE: 10/24/1955 56 y.o.  Admit date: 12/19/2011 Discharge date: 12/25/2011  Admission Diagnoses:  Discharge Diagnoses:  Active Problems:  * No active hospital problems. *   Past Medical History  Diagnosis Date  . Arthritis   . Allergy   . GERD (gastroesophageal reflux disease)   . Anxiety   . Depression   . Hypertension   . 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 History of blood clots 08/2011    spleen and kidney; "from ulcer on my aorta"  . Bronchitis 08/2011  . H/O hiatal hernia   . Migraines     "gone since starting BP meds 2007"  . 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 Diabetes mellitus     Type 2 NIDDM x 3 years  . Splenic cyst   . 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  She was admitted to step down for post op care.  She spent 4 days in step down and went to floor.  She received her post splenectomy immunizations while in house.  Her WBC climbed to 28000 but went down to 15 800 on 12/23/2011.  She tolerated her diet and wound vac discontinued on 12/25/2011.  She was discharge home with [facility]  For wound care and augmentin 875 mg BID for 5 more days.  I recommended she contact her primary care doctor at discharge for monitoring of her coumadin and diabetes management.  She will resume her diabetic diet and home medications.   Consults: None  Significant Diagnostic Studies: see labs   Treatments: surgery: splenectomy  Discharge Exam: Blood pressure 130/70, pulse 89, temperature 98.1 F (36.7 C), temperature source Oral, resp. rate 18, height 5\' 7"  (1.702 m), weight 214 lb 15.2 oz (97.5 kg), SpO2 93.00%. General appearance: alert and cooperative Resp: clear to auscultation bilaterally Incision/Wound:vac in place  clean  Disposition: 01-Home or Self Care  Discharge Orders    Future Orders Please Complete By Expires   Meningococcal conjugate vaccine 4-valent IM      Pneumococcal polysaccharide vaccine 23-valent greater than or equal to 2yo subcutaneous/IM      Pneumococcal polysaccharide vaccine 23-valent greater than or equal to 2yo subcutaneous/IM      Comments:   Give prior to discharge   Increase activity slowly      Driving Restrictions      Comments:   No driving for 2 weeks   Lifting restrictions      Comments:   No lifting greater than 15 lbs for 4 weeks   Discharge wound care:      Comments:   Home health nursing     Medication List  As of 12/25/2011  7:23 AM   STOP taking these medications         HYDROcodone-acetaminophen 5-325 MG per tablet         TAKE these medications         albuterol 108 (90 BASE) MCG/ACT inhaler   Commonly known as: PROVENTIL HFA;VENTOLIN HFA   Inhale 2 puffs into the lungs every 4 (four) hours as needed. For shortness of breath      ALPRAZolam 0.5 MG tablet   Commonly known as: XANAX   Take 0.25 mg by mouth 2 (two) times  daily as needed. For anxiety      amoxicillin-clavulanate 875-125 MG per tablet   Commonly known as: AUGMENTIN   Take 1 tablet by mouth every 12 (twelve) hours.      buPROPion 75 MG tablet   Commonly known as: WELLBUTRIN   Take 75 mg by mouth 2 (two) times daily.      cholestyramine 4 GM/DOSE powder   Commonly known as: QUESTRAN   Take 4 g by mouth every morning.      glipiZIDE 10 MG tablet   Commonly known as: GLUCOTROL   Take 10-20 mg by mouth 2 (two) times daily before a meal. Take 2 tablets in the morning and 1 tablet in the evening      GLUCOSAMINE-CHONDROITIN PO   Take 1 tablet by mouth daily.      HYDROmorphone 2 MG tablet   Commonly known as: DILAUDID   Take 1 tablet (2 mg total) by mouth every 4 (four) hours as needed for pain.      lisinopril-hydrochlorothiazide 20-12.5 MG per tablet   Commonly known as:  PRINZIDE,ZESTORETIC   Take 1 tablet by mouth every morning.      ondansetron 4 MG tablet   Commonly known as: ZOFRAN   Take 1 tablet (4 mg total) by mouth every 6 (six) hours as needed for nausea.      OVER THE COUNTER MEDICATION   Place 2 drops into both eyes daily as needed. Saline drop for dry eyes      pantoprazole 40 MG tablet   Commonly known as: PROTONIX   Take 40 mg by mouth 2 (two) times daily.      sitaGLIPtin 100 MG tablet   Commonly known as: JANUVIA   Take 100 mg by mouth daily.      traZODone 100 MG tablet   Commonly known as: DESYREL   Take 100 mg by mouth at bedtime.      venlafaxine XR 75 MG 24 hr capsule   Commonly known as: EFFEXOR-XR   Take 1 capsule (75 mg total) by mouth every evening.      warfarin 10 MG tablet   Commonly known as: COUMADIN   Take 10 mg by mouth 3 (three) times a week. Takes on Sun, Weds, and Fri only      warfarin 2.5 MG tablet   Commonly known as: COUMADIN   Take 2.5 mg by mouth 4 (four) times a week. Takes on Mon, Tues, Thurs, and Sat only      zinc sulfate 220 MG capsule   Take 220 mg by mouth daily.             Signed: Elah Avellino A. 12/25/2011, 7:23 AM

## 2011-12-25 NOTE — Progress Notes (Signed)
Physical Therapy Treatment Patient Details Name: Kristy Stewart MRN: [id number] DOB: 1956-05-05 Today's Date: 12/25/2011 Time: 5621-3086 PT Time Calculation (min): 15 min  PT Assessment / Plan / Recommendation Comments on Treatment Session  Emotional about pain. Has met all goals. No further PT needs at this time. Has a RW for longer distances at home.     Follow Up Recommendations  No PT follow up    Equipment Recommendations  None recommended by PT    Frequency     Plan All goals met and education completed, patient dischaged from PT services    Precautions / Restrictions Precautions Precautions: None       Mobility  Transfers Sit to Stand: 6: Modified independent (Device/Increase time) Stand to Sit: 6: Modified independent (Device/Increase time) Ambulation/Gait Ambulation/Gait Assistance: 6: Modified independent (Device/Increase time) Ambulation Distance (Feet): 150 Feet Assistive device: Rolling walker Gait Pattern: Trunk flexed Stairs Assistance: 6: Modified independent (Device/Increase time) Stair Management Technique: One rail Right;Forwards Number of Stairs: 5     Exercises     PT Goals Acute Rehab PT Goals PT Goal: Sit to Stand - Progress: Met PT Goal: Ambulate - Progress: Met PT Goal: Up/Down Stairs - Progress: Met  Visit Information  Last PT Received On: 12/25/11 Assistance Needed: +1    Subjective Data  Subjective: They took the wound vac out but have to leave this tube in and it still hurts so bad.    Cognition  Overall Cognitive Status: Appears within functional limits for tasks assessed/performed Arousal/Alertness: Awake/alert Orientation Level: Appears intact for tasks assessed Behavior During Session: Anxious (emotional about pain today)    Balance     End of Session PT - End of Session Equipment Utilized During Treatment: Gait belt Activity Tolerance: Patient tolerated treatment well;Patient limited by pain Patient left: in chair;with  call bell/phone within reach Nurse Communication: Patient requests pain meds    WHITLOW,Ignacio Lowder HELEN 12/25/2011, 9:22 AM

## 2011-12-25 NOTE — Progress Notes (Signed)
6 Days Post-Op  Subjective: Pt feels ok.  Many questions about care answered.  Objective: Vital signs in last 24 hours: Temp:  [98.1 F (36.7 C)-98.3 F (36.8 C)] 98.1 F (36.7 C) (05/07 0558) Pulse Rate:  [89-102] 89  (05/07 0558) Resp:  [18] 18  (05/07 0558) BP: (113-138)/(65-77) 130/70 mmHg (05/07 0558) SpO2:  [91 %-95 %] 93 % (05/07 0558) Last BM Date: 12/19/11  Intake/Output from previous day: 05/06 0701 - 05/07 0700 In: 480 [P.O.:480] Out: 4575 [Urine:4500; Drains:75] Intake/Output this shift:    Incision/Wound:vac in place.  JP minimal  Lab Results:   Basename 12/23/11 0330  WBC 15.8*  HGB 8.3*  HCT 25.9*  PLT 656*   BMET  Basename 12/23/11 0330  NA 132*  K 3.7  CL 99  CO2 22  GLUCOSE 148*  BUN 6  CREATININE 0.82  CALCIUM 8.4   PT/INR  Basename 12/24/11 0909  LABPROT 18.0*  INR 1.46   ABG No results found for this basename: PHART:2,PCO2:2,PO2:2,HCO3:2 in the last 72 hours  Studies/Results: No results found.  Anti-infectives: Anti-infectives     Start     Dose/Rate Route Frequency Ordered Stop   12/24/11 1000   amoxicillin-clavulanate (AUGMENTIN) 875-125 MG per tablet 1 tablet        1 tablet Oral Every 12 hours 12/24/11 0854     12/22/11 0800   vancomycin (VANCOCIN) 1,250 mg in sodium chloride 0.9 % 250 mL IVPB  Status:  Discontinued        1,250 mg 166.7 mL/hr over 90 Minutes Intravenous Every 12 hours 12/22/11 0549 12/24/11 0854   12/20/11 0730   vancomycin (VANCOCIN) IVPB 1000 mg/200 mL premix  Status:  Discontinued        1,000 mg 200 mL/hr over 60 Minutes Intravenous 3 times per day 12/20/11 0720 12/22/11 0549   12/20/11 0715   vancomycin (VANCOCIN) IVPB 1000 mg/200 mL premix  Status:  Discontinued     Comments: Pharmacy to adjust dose as needed.      1,000 mg 200 mL/hr over 60 Minutes Intravenous Every 12 hours 12/20/11 0704 12/20/11 0713   12/19/11 1600   ertapenem (INVANZ) 1 g in sodium chloride 0.9 % 50 mL IVPB  Status:   Discontinued        1 g 100 mL/hr over 30 Minutes Intravenous Every 24 hours 12/19/11 1554 12/24/11 0854   12/19/11 1115   ertapenem (INVANZ) 1 g in sodium chloride 0.9 % 50 mL IVPB  Status:  Discontinued        1 g 100 mL/hr over 30 Minutes Intravenous To Surgery 12/19/11 1104 12/19/11 1537   12/18/11 1413   vancomycin (VANCOCIN) 1,500 mg in sodium chloride 0.9 % 500 mL IVPB        1,500 mg 250 mL/hr over 120 Minutes Intravenous 120 min pre-op 12/18/11 1413 12/19/11 1019   12/18/11 1413   vancomycin (VANCOCIN) IVPB 1000 mg/200 mL premix  Status:  Discontinued        1,000 mg 200 mL/hr over 60 Minutes Intravenous 120 min pre-op 12/18/11 1413 12/18/11 1413          Assessment/Plan: s/p Procedure(s) (LRB): SPLENECTOMY (N/A) Discharge  LOS: 6 days    Kristy Siravo A. 12/25/2011

## 2011-12-25 NOTE — Discharge Instructions (Signed)
Splenectomy, Long-Term Care After A splenectomy is the surgical removal of a diseased or injured spleen. The most common reasons for the spleen to be removed are because of severe injury (trauma), sickle cell disease, or a condition that causes blood clotting problems (idiopathic thrombocytopenic purpura, ITP). The spleen is an organ located in the upper abdomen under your left ribs. It is a sponge-like organ, about the size of an orange, which acts as a filter. The spleen removes waste from the blood, maintains cells that make antibodies to help fight infection, and stores other blood cells. The spleen, along with other body systems and organs, plays an important role in the body's natural defense system (immune system). Once it is removed, there is a slightly greater chance of developing a serious, life-threatening infection (overwhelming postsplenectomy sepsis). It is important to take extra steps to prevent infection. Other precautions may be necessary to prevent blood clots from forming. PREVENTING INFECTION Your caregiver will recommend important steps to help prevent infection. These may include:  Making sure your immunizations are up to date, including:   Pneumococcus.   Seasonal flu (influenza).   Haemophilus influenzae type b (Hib).   Meningitis.   Making sure family members' vaccines are up to date.   In some cases, antibiotics may be needed if you get symptoms of infection. Not all patients need this type of treatment after a splenectomy. Talk to your caregiver about what is best for you if these symptoms develop.   Following good daily practices to prevent infection, such as:   Washing hands often, especially after preparing food, eating, changing diapers, and playing with children or animals.   Disinfecting surfaces regularly.   Avoiding others with active illness or infections.   Taking precautions to avoid mosquito and tick bites, such as:   Wearing proper clothing that  covers the entire body when you are in wooded or marshy areas.   Changing clothing right away and checking for bites after you have been outside.   Using insect spray.   Using insect netting.   Staying indoors during peak mosquito hours.  PREVENTING BLOOD CLOTS Splenectomy may increase your risk of forming a blood clot. This is of utmost concern immediately after surgery. However, it may continue as a lifelong risk. To help prevent blood clots, your caregiver may recommend:  Exercising as directed. Find a safe, regular exercise program that works well for you.   Getting up, stretching, and moving around every hour if you sit a lot or do not move about much at work or during travel time.   Taking medicines (such as aspirin) as directed.  TRAVEL If you travel in the [place] take care to avoid tick bites, especially in the [place] coastal areas. Ticks can spread the parasite that causes babesiosis. Babesiosis is a flu-like illness that is treatable. If you travel abroad where malaria is common, follow these guidelines:  Contact your caregiver and discuss the places you are visiting for specific advice.   Make sure all of your immunizations are up to date.   Get specific immunizations to guard against the disease risk in the country you are visiting.   Understand how to prevent infections, such as malaria, abroad. These infections can pose serious risk. Precautions may include:   Daily tablets to prevent malaria.   Using mosquito nets.   Using insect spray.   Bring your broad-spectrum, full-strength antibiotics with you.  HOME CARE INSTRUCTIONS   Take all medicines as directed. If you are  prescribed antibiotics, discuss with your caregiver the use of a probiotic supplement to prevent stomach upset.   Keep track of medicine refills so that you do not run out of medicine.   Inform your close contacts of your condition. Consider wearing a medical alert bracelet or carrying an ID card.    See your caregiver for vaccinations, follow-up exams, and testing as directed.   Follow all your caregiver's instructions on managing this and other conditions.  SEEK MEDICAL CARE IF:   You have an oral temperature above 102 F (38.9 C).   You develop signs of infection, such as chills and feeling unwell, that continue after taking the full-strength antibiotic.   You are considering travel abroad.   You have questions or concerns.  SEEK IMMEDIATE MEDICAL CARE IF:   You have chest pain along with:   Shortness of breath.   Pain in the back, neck, or jaw.   You have pain or swelling in the leg.   You develop a sudden headache and dizziness.  MAKE SURE YOU:   Understand these instructions.   Will watch your condition.   Will get help right away if you are not doing well or get worse.  Document Released: 01/24/2010 Document Revised: 07/26/2011 Document Reviewed: 01/24/2010 [facility] Patient Information 2012 [place], [place].     Home health for wound care. Contact primary care for medication and coumadin management ASAP Diabetes diet Shower My nurse will contact you with appointment for next week.   CC[facility]      [facility], [place] [phone]  OPEN ABDOMINAL SURGERY: POST OP INSTRUCTIONS  Always review your discharge instruction sheet given to you by the facility where your surgery was performed.  IF YOU HAVE DISABILITY OR FAMILY LEAVE FORMS, YOU MUST BRING THEM TO THE OFFICE FOR PROCESSING.  PLEASE DO NOT GIVE THEM TO YOUR DOCTOR.  1. A prescription for pain medication may be given to you upon discharge.  Take your pain medication as prescribed, if needed.  If narcotic pain medicine is not needed, then you may take acetaminophen (Tylenol) or ibuprofen (Advil) as needed. 2. Take your usually prescribed medications unless otherwise directed. 3. If you need a refill on your pain medication, please contact your pharmacy. They will contact our office to request  authorization.  Prescriptions will not be filled after 5pm or on week-ends. 4. You should follow a light diet the first few days after arrival home, such as soup and crackers, pudding, etc.unless your doctor has advised otherwise. A high-fiber, low fat diet can be resumed as tolerated.   Be sure to include lots of fluids daily. Most patients will experience some swelling and bruising on the chest and neck area.  Ice packs will help.  Swelling and bruising can take several days to resolve 5. Most patients will experience some swelling and bruising in the area of the incision. Ice pack will help. Swelling and bruising can take several days to resolve..  6. It is common to experience some constipation if taking pain medication after surgery.  Increasing fluid intake and taking a stool softener will usually help or prevent this problem from occurring.  A mild laxative (Milk of Magnesia or Miralax) should be taken according to package directions if there are no bowel movements after 48 hours. 7.  You may have steri-strips (small skin tapes) in place directly over the incision.  These strips should be left on the skin for 7-10 days.  If your surgeon used skin glue  on the incision, you may shower in 24 hours.  The glue will flake off over the next 2-3 weeks.  Any sutures or staples will be removed at the office during your follow-up visit. You may find that a light gauze bandage over your incision may keep your staples from being rubbed or pulled. You may shower and replace the bandage daily. 8. ACTIVITIES:  You may resume regular (light) daily activities beginning the next day--such as daily self-care, walking, climbing stairs--gradually increasing activities as tolerated.  You may have sexual intercourse when it is comfortable.  Refrain from any heavy lifting or straining until approved by your doctor. a. You may drive when you no longer are taking prescription pain medication, you can comfortably wear a seatbelt,  and you can safely maneuver your car and apply brakes b. Return to Work: ___________________________________ 9. You should see your doctor in the office for a follow-up appointment approximately two weeks after your surgery.  Make sure that you call for this appointment within a day or two after you arrive home to insure a convenient appointment time. OTHER INSTRUCTIONS:  _____________________________________________________________ _____________________________________________________________  WHEN TO CALL YOUR DOCTOR: 1. Fever over 101.0 2. Inability to urinate 3. Nausea and/or vomiting 4. Extreme swelling or bruising 5. Continued bleeding from incision. 6. Increased pain, redness, or drainage from the incision. 7. Difficulty swallowing or breathing 8. Muscle cramping or spasms. 9. Numbness or tingling in hands or feet or around lips.  The clinic staff is available to answer your questions during regular business hours.  Please don't hesitate to call and ask to speak to one of the nurses if you have concerns.  For further questions, please visit [email or web address]

## 2011-12-26 LAB — GLUCOSE, CAPILLARY: Glucose-Capillary: 144 mg/dL — ABNORMAL HIGH (ref 70–99)

## 2012-01-01 ENCOUNTER — Encounter (INDEPENDENT_AMBULATORY_CARE_PROVIDER_SITE_OTHER): Payer: Self-pay | Admitting: Surgery

## 2012-01-01 ENCOUNTER — Ambulatory Visit (INDEPENDENT_AMBULATORY_CARE_PROVIDER_SITE_OTHER): Payer: BC Managed Care – PPO | Admitting: Surgery

## 2012-01-01 VITALS — BP 114/67 | HR 107 | Temp 97.4°F | Ht 67.0 in | Wt 209.0 lb

## 2012-01-01 DIAGNOSIS — G8918 Other acute postprocedural pain: Secondary | ICD-10-CM

## 2012-01-01 MED ORDER — HYDROCODONE-ACETAMINOPHEN 5-325 MG PO TABS: 1.0000 | ORAL_TABLET | ORAL | Status: DC | PRN

## 2012-01-01 NOTE — Patient Instructions (Signed)
Return in 2 weeks.  Continue wound care.

## 2012-01-01 NOTE — Progress Notes (Signed)
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 She is slowly improving. I took out her JP drain today. Her wound is clean and granulating well. She is still very weak and her appetite is not returned. Her bowels are functioning well. Her abdomen is soft. She would need additional 4 weeks of recovery secondary to preoperative infection  at the time of surgery. She received her vaccines while in the hospital. Return to clinic in 2 weeks.

## 2012-01-15 ENCOUNTER — Encounter (INDEPENDENT_AMBULATORY_CARE_PROVIDER_SITE_OTHER): Payer: Self-pay | Admitting: Surgery

## 2012-01-15 ENCOUNTER — Ambulatory Visit (INDEPENDENT_AMBULATORY_CARE_PROVIDER_SITE_OTHER): Payer: BC Managed Care – PPO | Admitting: Surgery

## 2012-01-15 VITALS — BP 110/78 | HR 108 | Temp 97.3°F | Resp 14 | Ht 67.0 in | Wt 210.4 lb

## 2012-01-15 DIAGNOSIS — Z9889 Other specified postprocedural states: Secondary | ICD-10-CM

## 2012-01-15 NOTE — Progress Notes (Signed)
The patient returns after splenectomy. She is doing well. She recently had ear infection that has resolved.  Exam: Left upper quadrant wound almost completely healed. It is clean.  Impression: Status post splenectomy  Plan: Return to clinic one month.  Return to work 2 weeks  Stopped wet-to-dry dressings in 2 weeks

## 2012-01-15 NOTE — Patient Instructions (Signed)
Return 1 month.  Return to work 2 weeks.  Stop wet to dry dressings in 2 weeks.

## 2012-01-18 ENCOUNTER — Encounter (INDEPENDENT_AMBULATORY_CARE_PROVIDER_SITE_OTHER): Payer: Self-pay

## 2012-02-08 ENCOUNTER — Telehealth (INDEPENDENT_AMBULATORY_CARE_PROVIDER_SITE_OTHER): Payer: Self-pay | Admitting: General Surgery

## 2012-02-08 NOTE — Telephone Encounter (Signed)
Pt calling with concerns about her incision feeling like a rope.  Reassured her she is feeling the scar tissue that is maturing and will continue to go through changes for quite a while.  She may feel some pulling from time-to-time.  Pt is reassured and will call back as needed.

## 2012-02-15 ENCOUNTER — Encounter (INDEPENDENT_AMBULATORY_CARE_PROVIDER_SITE_OTHER): Payer: Self-pay | Admitting: Surgery

## 2012-02-15 ENCOUNTER — Ambulatory Visit (INDEPENDENT_AMBULATORY_CARE_PROVIDER_SITE_OTHER): Payer: BC Managed Care – PPO | Admitting: Surgery

## 2012-02-15 VITALS — BP 126/68 | HR 70 | Temp 98.1°F | Resp 16 | Ht 67.5 in | Wt 215.3 lb

## 2012-02-15 DIAGNOSIS — Z9889 Other specified postprocedural states: Secondary | ICD-10-CM

## 2012-02-15 MED ORDER — GABAPENTIN 300 MG PO CAPS: 300.0000 mg | ORAL_CAPSULE | Freq: Three times a day (TID) | ORAL | Status: DC

## 2012-02-15 NOTE — Patient Instructions (Signed)
Expect pain.  Try neurontin.  Return as needed.

## 2012-02-18 NOTE — Progress Notes (Signed)
Doing well.  Occasional incisional pain.  Wound healed.  Will release back to full duty.  Follow up as needed.

## 2012-06-20 ENCOUNTER — Telehealth: Payer: Self-pay | Admitting: Hematology & Oncology

## 2012-06-20 NOTE — Telephone Encounter (Signed)
Left pt message to call for appointment °

## 2012-06-23 ENCOUNTER — Telehealth: Payer: Self-pay | Admitting: Hematology & Oncology

## 2012-06-23 NOTE — Telephone Encounter (Signed)
Left messages on both phones for pt to call and schedule appointment °

## 2012-06-23 NOTE — Telephone Encounter (Signed)
Pt aware of 11-15 appointment

## 2012-07-04 ENCOUNTER — Ambulatory Visit: Payer: BC Managed Care – PPO

## 2012-07-04 ENCOUNTER — Other Ambulatory Visit: Payer: Self-pay

## 2012-07-04 ENCOUNTER — Other Ambulatory Visit (HOSPITAL_BASED_OUTPATIENT_CLINIC_OR_DEPARTMENT_OTHER): Payer: BC Managed Care – PPO | Admitting: Lab

## 2012-07-04 ENCOUNTER — Ambulatory Visit (HOSPITAL_BASED_OUTPATIENT_CLINIC_OR_DEPARTMENT_OTHER): Payer: BC Managed Care – PPO | Admitting: Hematology & Oncology

## 2012-07-04 VITALS — BP 105/67 | HR 100 | Temp 98.2°F | Resp 18 | Ht 67.0 in | Wt 225.0 lb

## 2012-07-04 DIAGNOSIS — D75839 Thrombocytosis, unspecified: Secondary | ICD-10-CM

## 2012-07-04 DIAGNOSIS — D473 Essential (hemorrhagic) thrombocythemia: Secondary | ICD-10-CM

## 2012-07-04 LAB — CBC WITH DIFFERENTIAL (CANCER CENTER ONLY)
BASO%: 0.6 % (ref 0.0–2.0)
Eosinophils Absolute: 0.3 10*3/uL (ref 0.0–0.5)
MCH: 29.5 pg (ref 26.0–34.0)
MONO%: 6.1 % (ref 0.0–13.0)
NEUT#: 5.4 10*3/uL (ref 1.5–6.5)
Platelets: 672 10*3/uL — ABNORMAL HIGH (ref 145–400)
RBC: 4.78 10*6/uL (ref 3.70–5.32)
RDW: 15.5 % (ref 11.1–15.7)
WBC: 9.4 10*3/uL (ref 3.9–10.0)

## 2012-07-04 LAB — CHCC SATELLITE - SMEAR

## 2012-07-04 NOTE — Progress Notes (Signed)
This office note has been dictated.

## 2012-07-04 NOTE — Progress Notes (Signed)
CC:   Gloriajean Dell. Andrey Campanile, M.D.  DIAGNOSES: 1. Thrombocytosis. 2. 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  HISTORY OF PRESENT ILLNESS:  Ms. Yeatman is a very nice 56 year old white female.  She is originally from [place].  We had a good talk about this.  She is from [facility], [place], which is the home of Theodoro Doing.  She is followed by [facility] [place].  She was referred because of thrombocytosis.  She apparently began to have problems earlier this year.  She had "blood clots".  From what I can gather, she had an aortic thrombus.  There is no history of blood clots in the family.  She said a blood clot went to the spleen.  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  This is becoming more of an issue for her.  She ultimately had it removed.  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  Going through her labs, her platelet count began to elevate I think about February or March.  She is on anticoagulation.  From what I can tell, she had hypercoagulable studies done.  Everything I think came back okay although there is a possibility of maybe a low protein S.  We were asked to see her because of the thrombocytosis.  She has no pain or burning in the hands or feet.  There are no headaches.  She has had no nausea or vomiting.  Going through the record, her CBC back in early October showed a white cell count of 9.6, hemoglobin 13.8, hematocrit 40.5, platelet count was 694.  MCV was 86.  White cell differential showed 53 segs, 37 lymphs, 6 monos, 3 eosinophils.  She does have a myriad of medical issues.  She is on quite a few medications.  She has had no rashes.  There has been no significant weight gain or loss.  I think she says she is going to be on blood thinner for a year.  PAST MEDICAL HISTORY: 1. Remarkable for non-insulin-dependent diabetes. 2. Hypertension. 3. GERD. 4. Insomnia. 5. Arthritis.  ALLERGIES:  Codeine and  derivatives.  MEDICATIONS: 1. Proventil inhaler 2 puffs q.4 hours p.r.n. 2. Xanax 0.25 mg b.i.d. p.r.n. 3. Wellbutrin 75 mg p.o. b.i.d. 4. Chantix daily. 5. Glucotrol 20 mg p.o. I think b.i.d. 6. Levsin 0.125 mg p.o. daily. 7. Zestoretic (20/12.5) one p.o. daily. 8. Protonix 40 mg p.o. b.i.d. 9. Phenergan 25 mg q.6 hours p.r.n. 10.Januvia 100 mg p.o. daily. 11.Clozaril 100 mg p.o. q.h.s. 12.Coumadin 10 mg alternating with 2.5 mg daily. 13.Questran 4 g daily.  SOCIAL HISTORY:  Negative for I think tobacco use.  There is no significant alcohol use.  FAMILY HISTORY:  Relatively noncontributory.  REVIEW OF SYSTEMS:  As stated in history of present illness.  PHYSICAL EXAMINATION:  General:  This is a fairly well-developed, well- nourished white female in no obvious distress.  Vital signs:  Shows a temperature of 98.2, pulse 100, respiratory rate 18, blood pressure is 105/67.  Weight is 225.  Head and neck:  Shows a normocephalic, atraumatic skull.  There are no ocular or oral lesions.  There are no palpable cervical or supraclavicular lymph nodes.  Lungs:  Clear bilaterally.  Cardiac:  Regular rate and rhythm with a normal S1 and S2. There are no murmurs, rubs or bruits.  Abdomen:  Soft with good bowel sounds.  She has a well-healed splenectomy scar.  There is no fluid wave.  There is no palpable hepatomegaly.  Back:  No tenderness over the spine, ribs or hips.  Extremities:  Shows no clubbing, cyanosis or edema.  Skin:  No rash, ecchymosis or petechia.  LABORATORY STUDIES:  White cell count is 9.4, hemoglobin 14.1, hematocrit 41.9, platelet count 672.  MCV is 88.  Peripheral smear shows a normochromic, normocytic population of red blood cells.  There are no nucleated red blood cells.  There are no teardrop cells.  I see no rouleaux formation.  There are no schistocytes or spherocytes.  White cells appear normal in morphology and maturation.  There are no hypersegmented polys.   There are no immature myeloid or lymphoid forms. There are no atypical lymphocytes.  There are no blasts.  Platelets are increased in number.  The vast majority of platelets are small.  She did have a couple large platelets.  IMPRESSION:  Ms. Mayerson is a very nice 56 year old white female with thrombocytosis.  Her spleen is removed.  One has to believe that having her spleen out is the most likely reason for her to have the thrombocytosis.  I am more interested in these blood clots.  Again, they appear to be an aortic thrombus.  She had a hypercoagulable workup which was negative outside of I think a possible decreased protein S.  Polycythemia vera can sometimes present with a thrombotic state with an unusual site for thrombosis.  I think the key for Ms. Ivery is going to be the JAK2 assay.  I am sending this off.  It is possible that she may have a positive JAK2 assay which could indicate a myeloproliferative neoplasm.  I am also sending off iron studies.  I am sending off a von Willebrand panel on her to see if there is possibility of essential thrombocythemia.  Again, I would have to suspect that her splenectomy is the most likely cause for her to have the thrombocytosis.  We will await the results of her lab work.  I want to see her back in about 2 months or so.  I will recheck her CBC.  She is on Coumadin.  This will certainly affect her protein S levels.  I cannot check her protein S until after she gets off the Coumadin.  I spent a good hour or so with Ms. Mckinniss.  She is very, very nice.  It was very nice talking to her about [place].    ______________________________ Josph Macho, M.D. PRE/MEDQ  D:  07/04/2012  T:  07/04/2012  Job:  [id number]

## 2012-07-07 LAB — FERRITIN: Ferritin: 15 ng/mL (ref 10–291)

## 2012-07-07 LAB — IRON AND TIBC
%SAT: 15 % — ABNORMAL LOW (ref 20–55)
TIBC: 362 ug/dL (ref 250–470)

## 2012-07-07 LAB — VON WILLEBRAND PANEL: Ristocetin Co-factor, Plasma: 63 % (ref 42–200)

## 2012-08-14 ENCOUNTER — Telehealth: Payer: Self-pay | Admitting: *Deleted

## 2012-08-14 NOTE — Telephone Encounter (Addendum)
Message copied by Mirian Capuchin on Thu Aug 14, 2012  3:32 PM ------      Message from: Arlan Organ R      Created: Thu Aug 14, 2012 11:01 AM       I left a message about her labs.  I told her that the iron is low and that she could try some oral iron - OTC.  If this causes some constipation, then we can give IV iron.  Pete Dr. Myna Hidalgo gave pt this message.

## 2012-08-22 ENCOUNTER — Telehealth: Payer: Self-pay | Admitting: Hematology & Oncology

## 2012-08-22 NOTE — Telephone Encounter (Signed)
Left pt message moved 1-17 to 1-23

## 2012-08-25 ENCOUNTER — Telehealth: Payer: Self-pay | Admitting: Hematology & Oncology

## 2012-08-25 NOTE — Telephone Encounter (Signed)
Pt moved 1-23 to 2-6

## 2012-08-25 NOTE — Telephone Encounter (Signed)
MD aware pt moved appointment to 09-25-12

## 2012-09-05 ENCOUNTER — Other Ambulatory Visit: Payer: Self-pay | Admitting: Lab

## 2012-09-05 ENCOUNTER — Ambulatory Visit: Payer: Self-pay | Admitting: Hematology & Oncology

## 2012-09-11 ENCOUNTER — Ambulatory Visit: Payer: Self-pay | Admitting: Hematology & Oncology

## 2012-09-11 ENCOUNTER — Other Ambulatory Visit: Payer: Self-pay | Admitting: Lab

## 2012-09-25 ENCOUNTER — Ambulatory Visit (HOSPITAL_BASED_OUTPATIENT_CLINIC_OR_DEPARTMENT_OTHER): Payer: BC Managed Care – PPO | Admitting: Hematology & Oncology

## 2012-09-25 ENCOUNTER — Other Ambulatory Visit (HOSPITAL_BASED_OUTPATIENT_CLINIC_OR_DEPARTMENT_OTHER): Payer: BC Managed Care – PPO | Admitting: Lab

## 2012-09-25 VITALS — BP 105/62 | HR 90 | Temp 98.1°F | Resp 16 | Ht 67.0 in | Wt 231.0 lb

## 2012-09-25 DIAGNOSIS — D473 Essential (hemorrhagic) thrombocythemia: Secondary | ICD-10-CM

## 2012-09-25 DIAGNOSIS — D75839 Thrombocytosis, unspecified: Secondary | ICD-10-CM

## 2012-09-25 DIAGNOSIS — Z9081 Acquired absence of spleen: Secondary | ICD-10-CM

## 2012-09-25 DIAGNOSIS — E119 Type 2 diabetes mellitus without complications: Secondary | ICD-10-CM

## 2012-09-25 DIAGNOSIS — I7411 Embolism and thrombosis of thoracic aorta: Secondary | ICD-10-CM

## 2012-09-25 DIAGNOSIS — I7410 Embolism and thrombosis of unspecified parts of aorta: Secondary | ICD-10-CM

## 2012-09-25 LAB — CBC WITH DIFFERENTIAL (CANCER CENTER ONLY)
BASO#: 0 10*3/uL (ref 0.0–0.2)
EOS%: 3.3 % (ref 0.0–7.0)
Eosinophils Absolute: 0.3 10*3/uL (ref 0.0–0.5)
HCT: 40.5 % (ref 34.8–46.6)
HGB: 13.9 g/dL (ref 11.6–15.9)
MCH: 30.3 pg (ref 26.0–34.0)
MCHC: 34.3 g/dL (ref 32.0–36.0)
MONO%: 7.1 % (ref 0.0–13.0)
NEUT#: 4.5 10*3/uL (ref 1.5–6.5)
NEUT%: 49.9 % (ref 39.6–80.0)
RBC: 4.59 10*6/uL (ref 3.70–5.32)

## 2012-09-25 NOTE — Progress Notes (Signed)
This office note has been dictated.

## 2012-09-26 NOTE — Progress Notes (Signed)
CC:   Mady Gemma, PA  DIAGNOSES: 1. Thrombocytosis. 2. Aortic thrombus-negative hypercoagulable workup.  CURRENT THERAPY:  Patient to start aspirin 162 mg p.o. daily.  INTERIM HISTORY:  Kristy Stewart comes in for followup.  We initially saw her back in November.  At that point in time, I was more interested in this aortic thrombus.  She did have followup angiogram.  There was no evidence of residual thrombus, although there was a plaque that was noted to be chronic.  I am not sure if this plaque is related to the thrombus or not.  She has been on Coumadin for a year.  I think we can get her off the Coumadin and put her on aspirin.  She will need to have a followup protein C and protein S evaluation off the Coumadin.  We will do this when she comes back.  Her thrombocytosis has not been an issue.  I believe this is because of her splenectomy.  We did do a JAK2 assay.  The JAK2 assay was negative.  She is on oral iron.  She is not taking this too religiously.  She is still working.  She is doing well at work.  There is no real fatigue.  She has had no nausea or vomiting.  There has been no headache.  She has had no leg pain.  She does state an occasional numbness in the, I think, right foot.  I am not sure how to explain this.  PHYSICAL EXAMINATION:  General:  This is a well-developed, well- nourished white female in no obvious distress.  Vital signs: Temperature of 98.1, pulse 90, respiratory rate 16, blood pressure 105/62.  Weight is 231.  Head and neck:  Normocephalic, atraumatic skull.  There are no ocular or oral lesions.  There are no palpable cervical or supraclavicular lymph nodes.  Lungs:  Clear bilaterally. Cardiac:  Regular rate and rhythm with a normal S1 and S2.  There are no murmurs, rubs, or bruits.  Abdomen:  Soft with good bowel sounds.  There is no palpable abdominal mass.  There is no fluid wave.  There is no palpable hepatosplenomegaly.  Back:  No  tenderness over the spine, ribs, or hips.  Extremities:  No clubbing, cyanosis, or edema.  No venous cord is noted in her legs.  She has good pulses in her distal extremities. Skin:  No rashes, ecchymoses, or petechiae.  LABORATORY STUDIES:  White cell count is 9, hemoglobin 12.9, hematocrit 40.5, platelet count 606.  MCV is 88.  IMPRESSION:  Kristy Stewart is a very nice 57 year old white female with thrombocytosis.  I forgot to mention that when we first saw her, we did do a von Willebrand panel on her.  She had normal von Willebrand levels.  Again, I have got to believe that the thrombocytosis is related to her having her spleen out.  I do not believe that the aortic thrombus is related to the thrombocytosis.  She does have diabetes.  She does have smoking issues. I think these would be more likely the source of the aortic problem.  Hopefully, the 2 baby aspirin a day will help minimize her risk of thrombus recurring.  We will plan to get her back in another couple of months for followup.  Again, we need to follow up on the protein S and protein C levels.    ______________________________ Josph Macho, M.D. PRE/MEDQ  D:  09/25/2012  T:  09/26/2012  Job:  [id number]

## 2012-12-18 ENCOUNTER — Telehealth: Payer: Self-pay | Admitting: Hematology & Oncology

## 2012-12-18 NOTE — Telephone Encounter (Signed)
Patient called and cx 12/22/12 and stated she will call back to resch

## 2012-12-22 ENCOUNTER — Other Ambulatory Visit: Payer: Self-pay | Admitting: Lab

## 2012-12-22 ENCOUNTER — Ambulatory Visit: Payer: Self-pay | Admitting: Hematology & Oncology

## 2012-12-24 ENCOUNTER — Ambulatory Visit: Payer: Self-pay | Admitting: Hematology & Oncology

## 2012-12-24 ENCOUNTER — Other Ambulatory Visit: Payer: Self-pay | Admitting: Lab

## 2013-01-21 ENCOUNTER — Other Ambulatory Visit: Payer: Self-pay | Admitting: Family Medicine

## 2013-01-21 DIAGNOSIS — R140 Abdominal distension (gaseous): Secondary | ICD-10-CM

## 2013-01-21 DIAGNOSIS — R160 Hepatomegaly, not elsewhere classified: Secondary | ICD-10-CM

## 2013-01-23 ENCOUNTER — Other Ambulatory Visit: Payer: Self-pay

## 2013-01-23 ENCOUNTER — Ambulatory Visit
Admission: RE | Admit: 2013-01-23 | Discharge: 2013-01-23 | Disposition: A | Discharge status: Home / Self Care | Payer: BC Managed Care – PPO | Source: Ambulatory Visit | Attending: Family Medicine | Admitting: Family Medicine

## 2013-01-23 DIAGNOSIS — R160 Hepatomegaly, not elsewhere classified: Secondary | ICD-10-CM

## 2013-01-23 DIAGNOSIS — R140 Abdominal distension (gaseous): Secondary | ICD-10-CM

## 2013-01-23 MED ORDER — IOHEXOL 300 MG/ML  SOLN
125.0000 mL | Freq: Once | INTRAMUSCULAR | Status: AC | PRN
  Administered 2013-01-23: 125 mL via INTRAVENOUS

## 2013-03-19 ENCOUNTER — Encounter: Payer: Self-pay | Admitting: Hematology & Oncology

## 2013-03-19 ENCOUNTER — Other Ambulatory Visit (HOSPITAL_BASED_OUTPATIENT_CLINIC_OR_DEPARTMENT_OTHER): Payer: BC Managed Care – PPO | Admitting: Lab

## 2013-03-19 ENCOUNTER — Ambulatory Visit (HOSPITAL_BASED_OUTPATIENT_CLINIC_OR_DEPARTMENT_OTHER): Payer: BC Managed Care – PPO | Admitting: Hematology & Oncology

## 2013-03-19 VITALS — BP 113/63 | HR 89 | Temp 98.0°F | Resp 16 | Ht 67.0 in | Wt 248.0 lb

## 2013-03-19 DIAGNOSIS — D473 Essential (hemorrhagic) thrombocythemia: Secondary | ICD-10-CM

## 2013-03-19 DIAGNOSIS — I7410 Embolism and thrombosis of unspecified parts of aorta: Secondary | ICD-10-CM

## 2013-03-19 DIAGNOSIS — I749 Embolism and thrombosis of unspecified artery: Secondary | ICD-10-CM

## 2013-03-19 DIAGNOSIS — D75839 Thrombocytosis, unspecified: Secondary | ICD-10-CM

## 2013-03-19 DIAGNOSIS — Z9081 Acquired absence of spleen: Secondary | ICD-10-CM

## 2013-03-19 DIAGNOSIS — D509 Iron deficiency anemia, unspecified: Secondary | ICD-10-CM

## 2013-03-19 DIAGNOSIS — Z9089 Acquired absence of other organs: Secondary | ICD-10-CM

## 2013-03-19 DIAGNOSIS — I8290 Acute embolism and thrombosis of unspecified vein: Secondary | ICD-10-CM

## 2013-03-19 DIAGNOSIS — I7411 Embolism and thrombosis of thoracic aorta: Secondary | ICD-10-CM

## 2013-03-19 HISTORY — DX: Thrombocytosis, unspecified: D75.839

## 2013-03-19 HISTORY — DX: Iron deficiency anemia, unspecified: D50.9

## 2013-03-19 LAB — CBC WITH DIFFERENTIAL (CANCER CENTER ONLY)
BASO%: 0.4 % (ref 0.0–2.0)
Eosinophils Absolute: 0.3 10*3/uL (ref 0.0–0.5)
LYMPH%: 34.9 % (ref 14.0–48.0)
MCH: 31.9 pg (ref 26.0–34.0)
MCV: 92 fL (ref 81–101)
MONO#: 0.7 10*3/uL (ref 0.1–0.9)
MONO%: 6.5 % (ref 0.0–13.0)
NEUT#: 5.9 10*3/uL (ref 1.5–6.5)
Platelets: 542 10*3/uL — ABNORMAL HIGH (ref 145–400)
RBC: 4.7 10*6/uL (ref 3.70–5.32)
RDW: 13.1 % (ref 11.1–15.7)
WBC: 10.7 10*3/uL — ABNORMAL HIGH (ref 3.9–10.0)

## 2013-03-19 NOTE — Progress Notes (Signed)
This office note has been dictated.

## 2013-03-20 LAB — PROTEIN C ACTIVITY: Protein C Activity: 200 % — ABNORMAL HIGH (ref 75–133)

## 2013-03-20 LAB — D-DIMER, QUANTITATIVE: D-Dimer, Quant: 0.27 ug/mL-FEU (ref 0.00–0.48)

## 2013-03-20 LAB — PROTEIN S, TOTAL: Protein S Total: 73 % (ref 60–150)

## 2013-03-20 NOTE — Progress Notes (Signed)
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 2. 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  CURRENT THERAPY:  Aspirin 162 mg p.o. daily.  INTERIM HISTORY:  Kristy Stewart comes in for followup.  She is doing okay. She is having some arthritic issues.  Her knees bother her.  She has gained weight.  She is not happy about the weight gain.  The patient has had some financial issues that she is trying to deal with but these seem to be getting better.  So far, her hypercoagulable workup has all been negative.  Her JAK2 assay was negative.  We are checking her protein C and protein S levels.  She has had no abdominal pain.  She did have a CT scan done, I think a month ago,  she said.  This was done because of abdominal pain that point in time.  This appears was unremarkable.  She had no lymphadenopathy.  Liver looked okay.  There has been no bleeding.  She has had no rashes.  She continues on her medications for diabetes.  PHYSICAL EXAMINATION:  General:  This is a somewhat obese white female in no obvious distress.  Vital signs:  Temperature of 98, pulse is 89, respiratory rate 18, blood pressure 113/63.  Weight is 148.  Head and neck:  Normocephalic, atraumatic skull.  There are no ocular or oral lesions.  There are no palpable cervical or supraclavicular lymph nodes. Lungs:  Clear bilaterally.  Cardiac:  Regular rate and rhythm with a normal S1 and S2.  There are no murmurs, rubs or bruits.  Abdomen: Soft.  She has good bowel sounds.  There is no fluid wave.  There is no palpable hepatosplenomegaly.  Back:  No tenderness over the spine, ribs, or hips.  Extremities:  Show no clubbing, cyanosis or edema. Neurological:  Shows no focal neurological deficits.  Skin:  Shows no rashes, ecchymosis, or petechia.  LABORATORY STUDIES:  White cell count 10.7, hemoglobin 15, hematocrit 43.2, platelet count 542.  MCV is 92.  IMPRESSION:   Kristy Stewart is a very nice 57 year old white female with thrombocytosis.  She has a history of an aortic thrombus.  We will see what her protein C and protein S levels are.  I would be surprised if they were low. She continues on aspirin.  However, her platelet count continues to respond to oral iron.  I told that she always will have to some degree, thrombocytosis, because of her spleen being removed.  From my point of view, I will plan to get her back in another 6 months. I think this would be reasonable for followup.    ______________________________ Josph Macho, M.D. PRE/MEDQ  D:  03/19/2013  T:  03/20/2013  Job:  [id number]

## 2013-05-03 IMAGING — CR DG CHEST 2V
2 series · 2 of 2 positions shown · non-contrast
Comparison: None.

CLINICAL DATA: Flu symptoms, cough, vomiting

CHEST - 2 VIEW

[w chest pa]
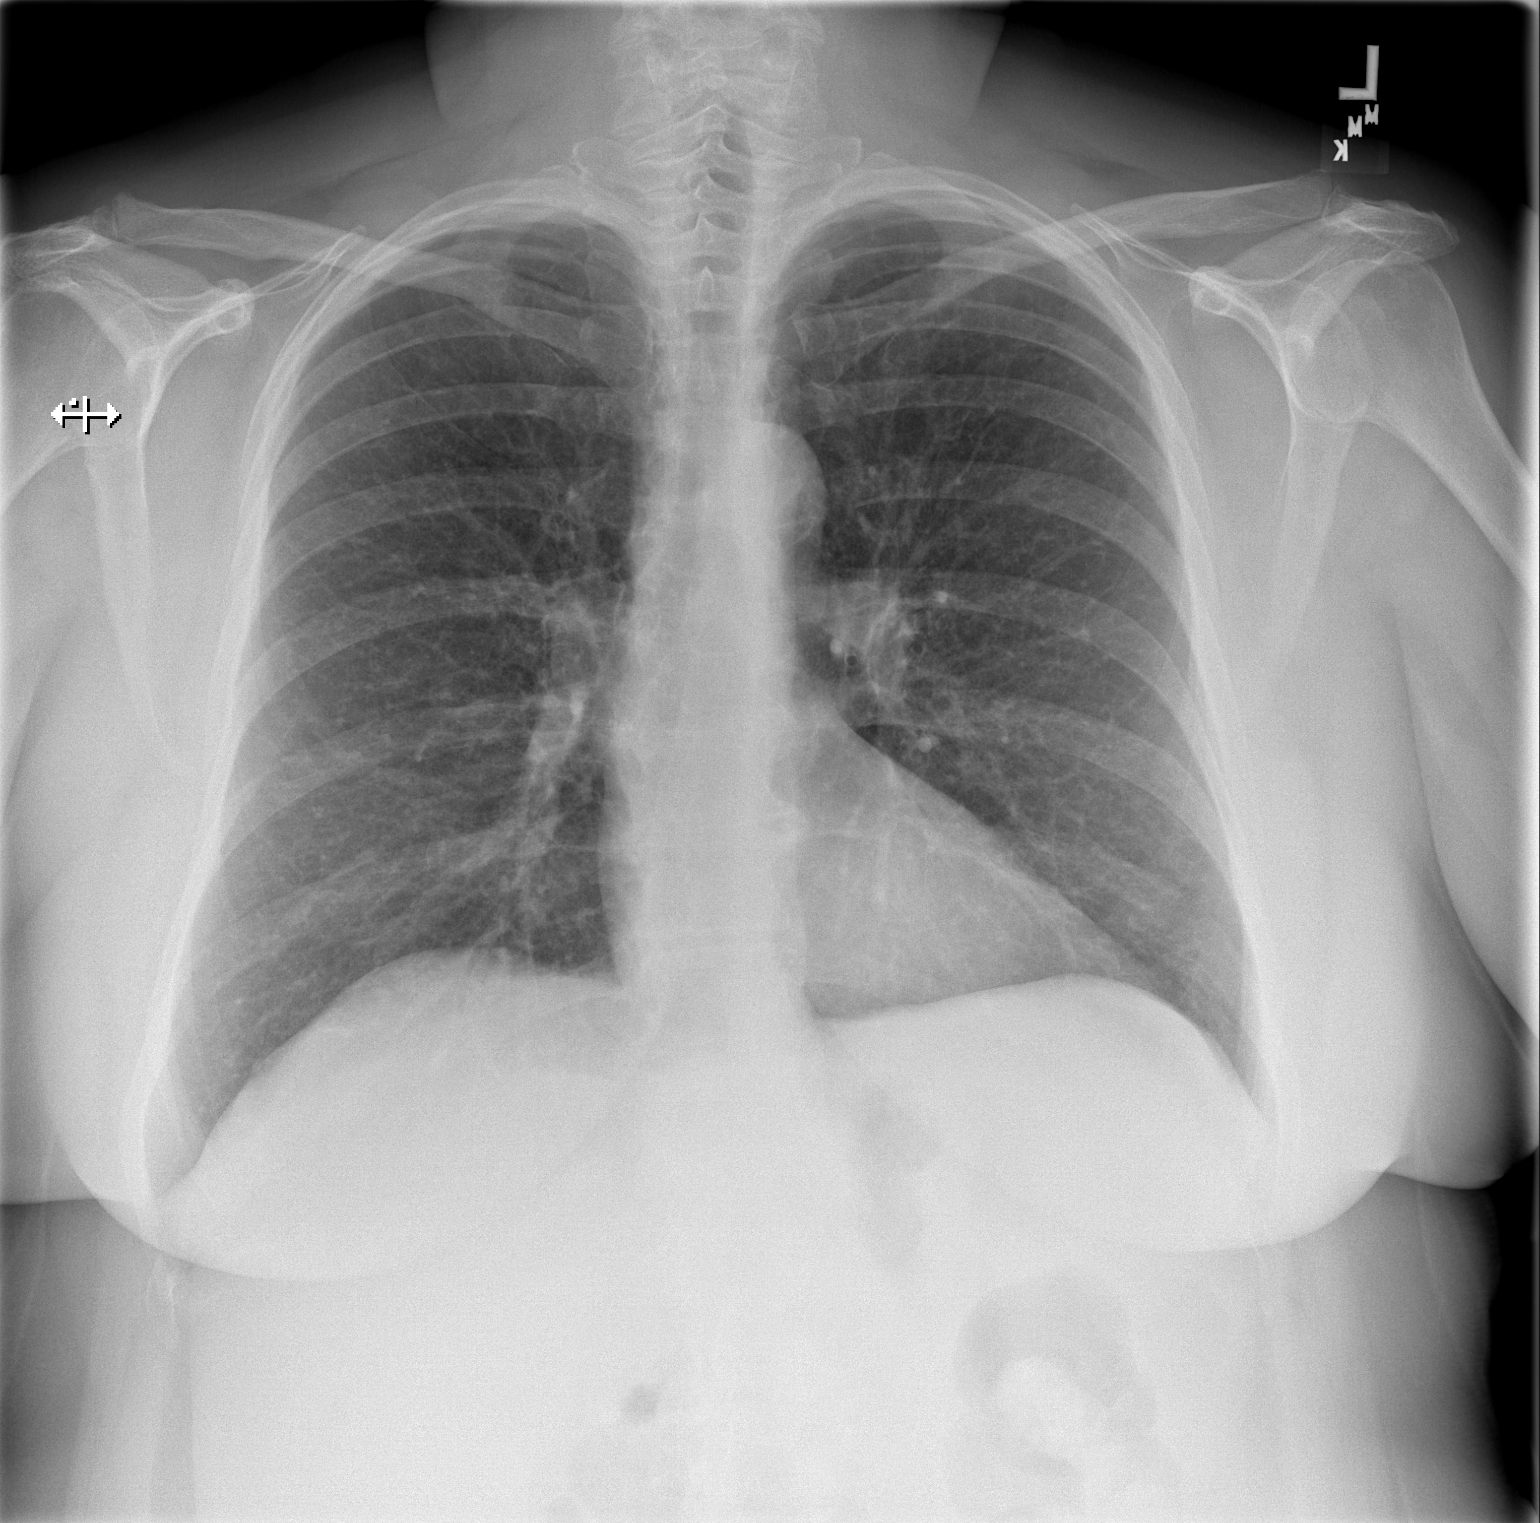

[w chest lat]
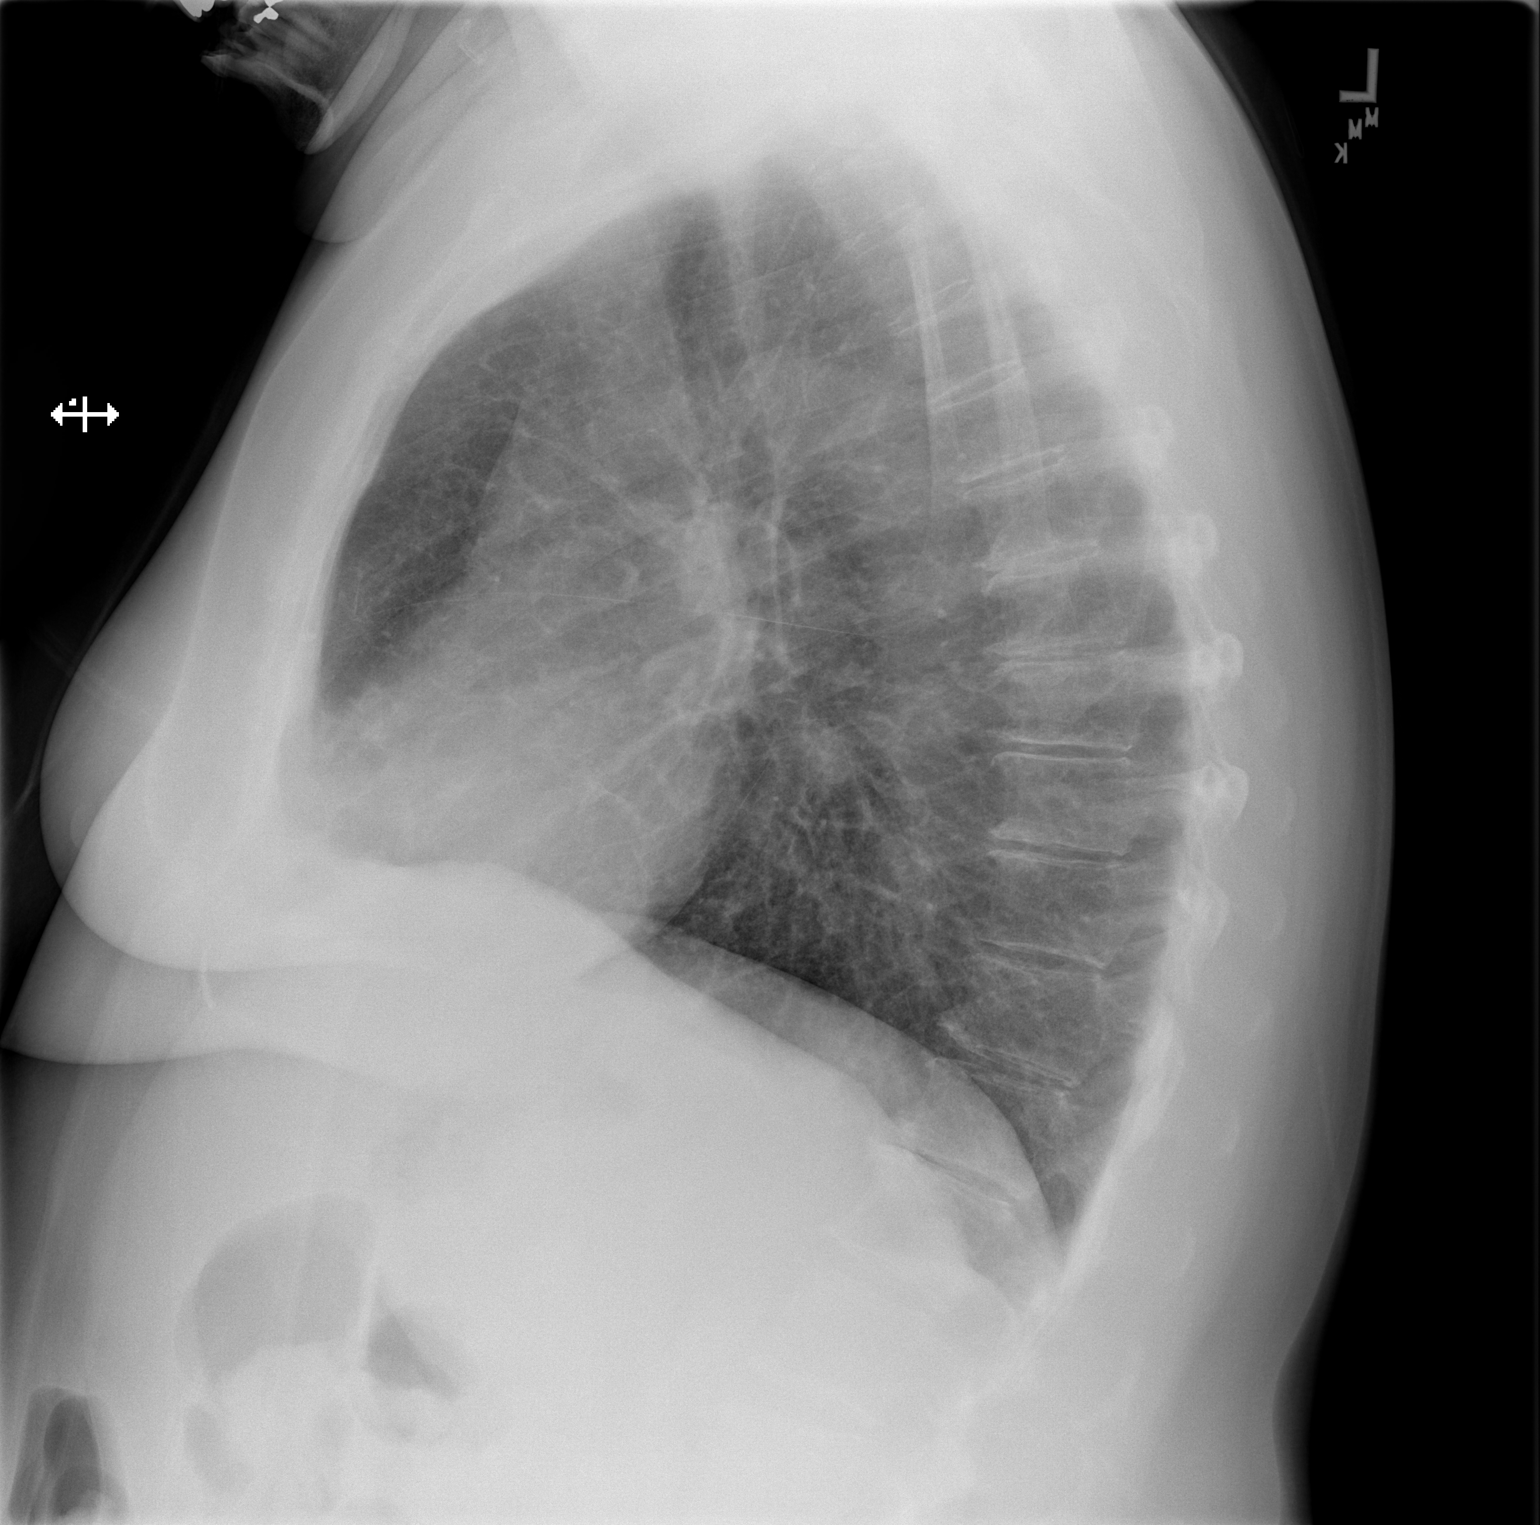

[2 of 2 positions shown; findings below may reference images not displayed]

FINDINGS: Normal heart size and vascularity.  Slight diffuse
interstitial prominence, nonspecific.  Negative for edema,
pneumonia, collapse, consolidation, effusion or pneumothorax.
Trachea midline.  Mild thoracic spondylosis.
IMPRESSION: Nonspecific interstitial prominence.  No acute chest process

## 2013-05-10 IMAGING — CT CT CTA ABD/PEL W/CM AND/OR W/O CM
2 of 9 series · 12 of 46 positions shown, 17 images · IV contrast (omnipaque)
Comparison: 09/18/2007 abdominal ultrasound

CLINICAL DATA: Severe abdominal pain, nausea, vomiting, diarrhea

CT ANGIOGRAPHY ABDOMEN AND PELVIS
TECHNIQUE: Multidetector CT imaging of the abdomen and pelvis was
performed using the standard protocol during bolus administration
of intravenous contrast.  Multiplanar reconstructed images
including MIPs were obtained and reviewed to evaluate the vascular
anatomy.
Contrast: 100mL OMNIPAQUE IOHEXOL 300 MG/ML IV SOLN

[Series 5: abd cta 2.0 (id) · axial · 0.77mm/px · z∈[+808,+1204]mm · 11 of 231 slices shown, 15 images]
[im 22/231  soft-tissue]
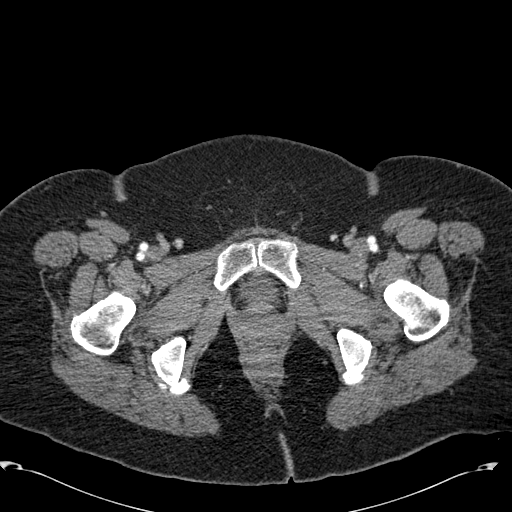
[im 22/231  bone]
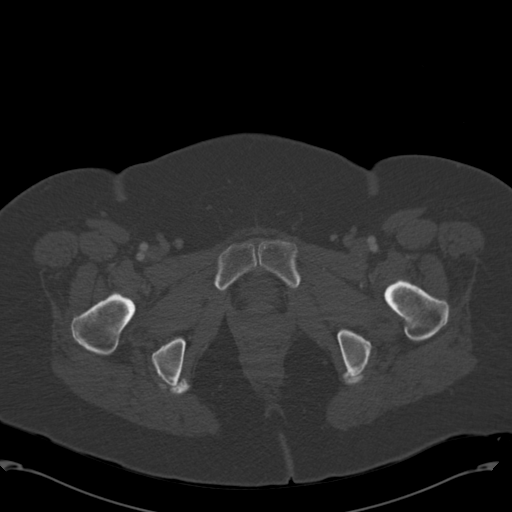
[im 44/231  soft-tissue]
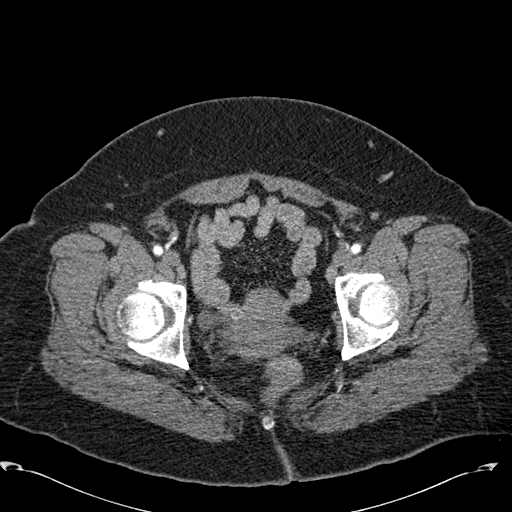
[im 66/231  soft-tissue]
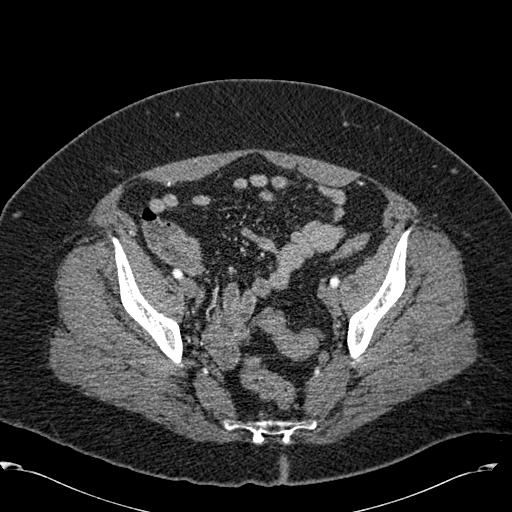
[im 88/231  soft-tissue]
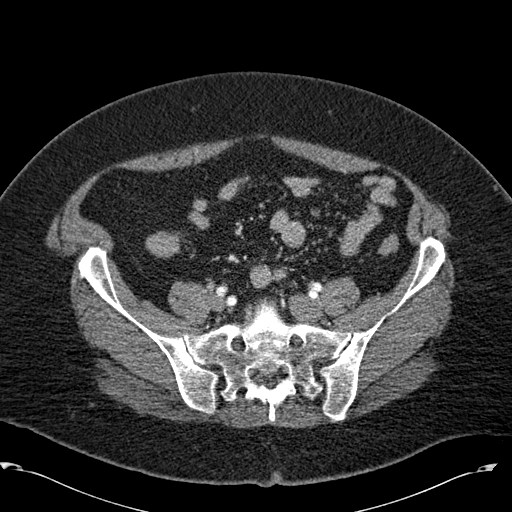
[im 121/231  soft-tissue]
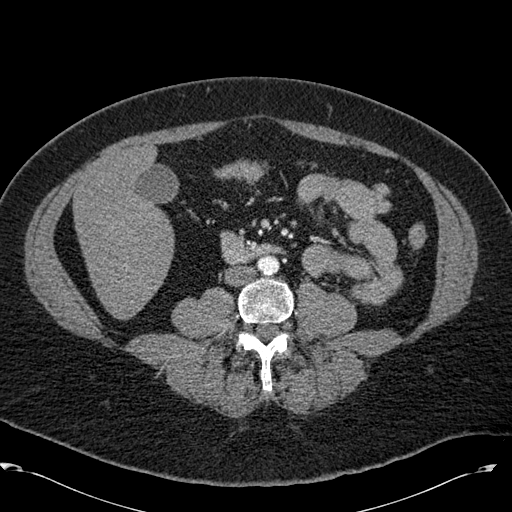
[im 143/231  soft-tissue]
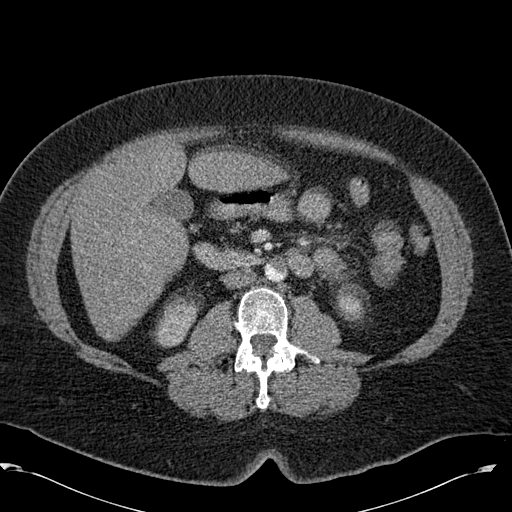
[im 165/231  soft-tissue]
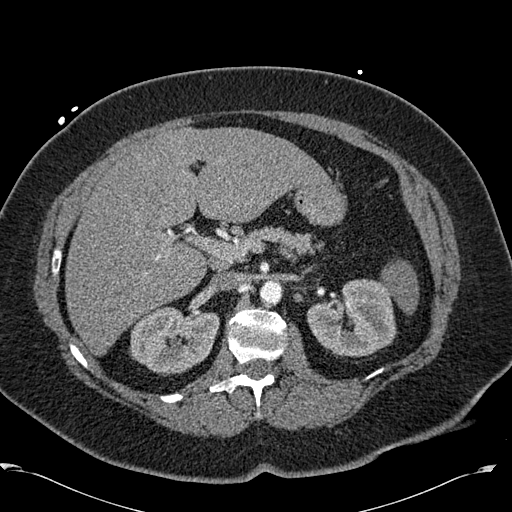
[im 187/231  soft-tissue]
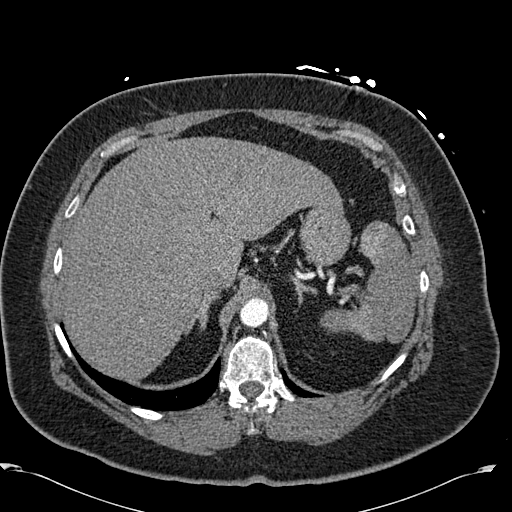
[im 187/231  lung]
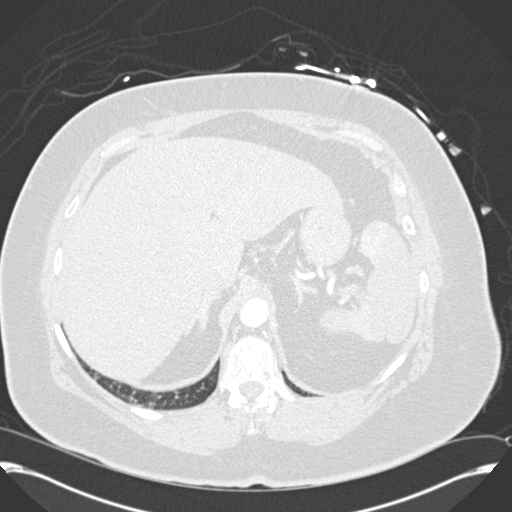
[im 198/231  lung]
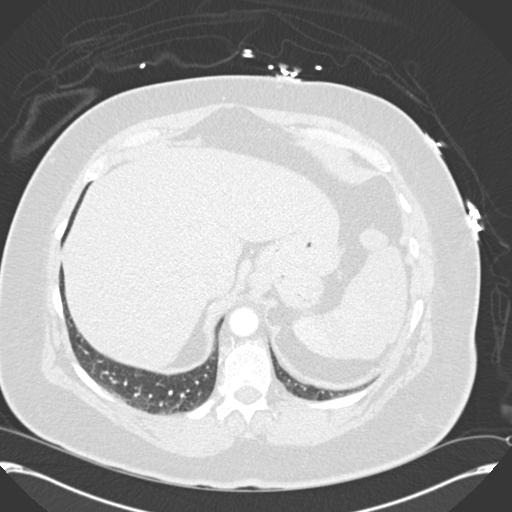
[im 209/231  soft-tissue]
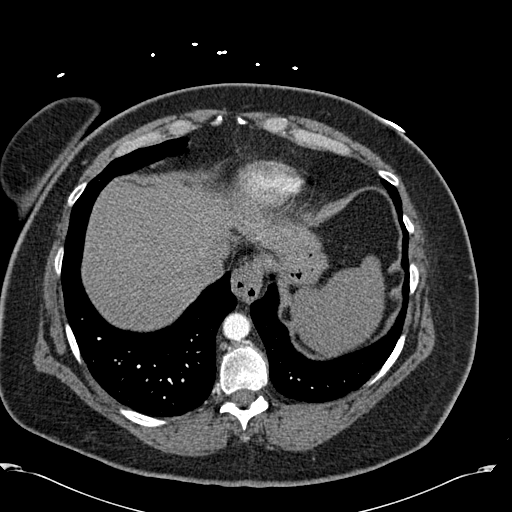
[im 209/231  lung]
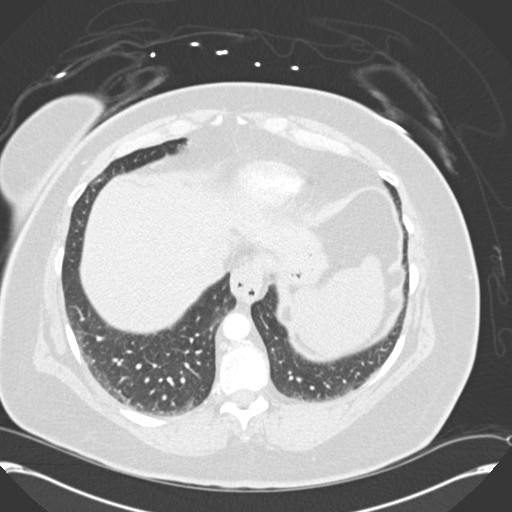
[im 209/231  bone]
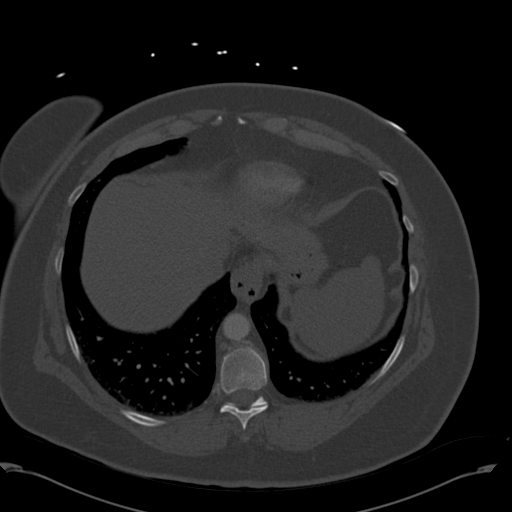
[im 220/231  lung]
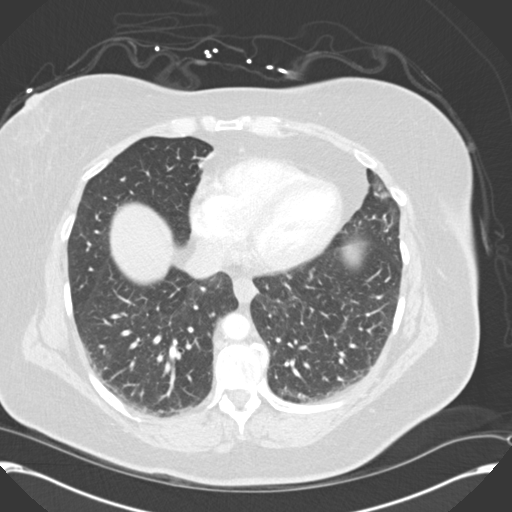

[Series 605: <mpr thick cor · coronal · 0.90mm/px · 1 of 85 slices shown, 2 images]
[im 43/85  soft-tissue]
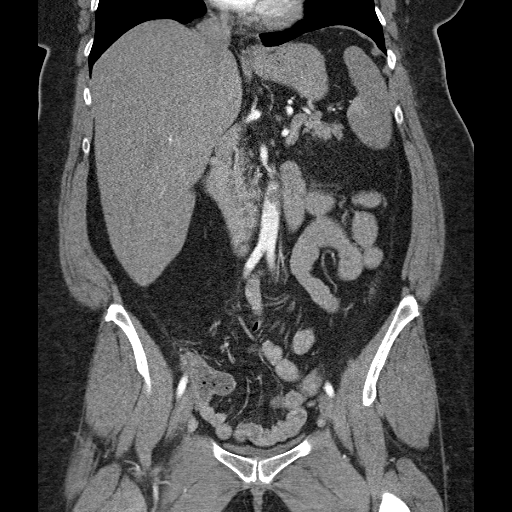
[im 43/85  bone]
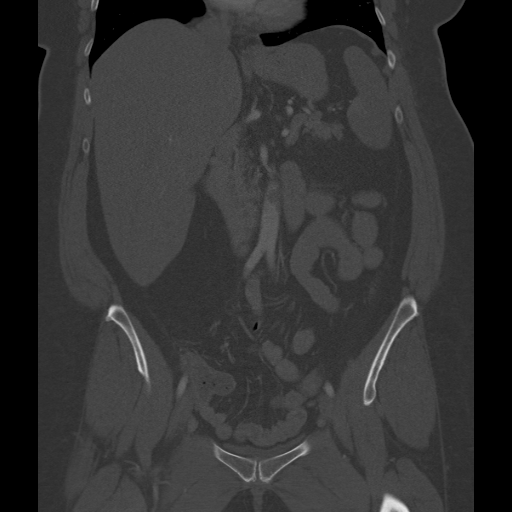

[12 of 46 positions shown; findings below may reference images not displayed]

FINDINGS: Lower thoracic aorta demonstrates a small amount of
hypodense eccentric wall plaque formation versus nonocclusive
thrombus, image #2.  Otherwise the lower thoracic aorta is patent.
Abdominal aorta demonstrates mild atherosclerotic changes without
aneurysm or dissection.  No acute retroperitoneal hemorrhage.

Celiac origin is minimally narrowed by a superior aspect
noncalcified plaque formation.  Degree of celiac origin narrowing
is less than 50%.  Celiac branches remain patent.

SMA origin is widely patent.  SMA main ileocolic trunk is
visualized and patent supplying jejunal and colic branches
throughout the mesentery.  Exam is limited with some respiratory
motion artifact through the abdomen.

Infrarenal abdominal aorta demonstrates anterior hypodense
noncalcified plaque formation or thrombus narrowing the aortic
lumen approximately 50%, image 89.  This extends to the IMA origin
which is not well visualized however the IMA itself is patent
distally.  Aortic bifurcation is patent.  Pelvic iliac vessels
demonstrate mild atherosclerosis but are patent.

Additional axial imaging:  Liver demonstrates no focal hepatic
abnormality. Hepatic and portal veins are patent.  The gallbladder,
biliary system, pancreas, adrenal glands, and left kidney within
normal limits and demonstrate no acute finding.  Spleen
demonstrates a patchy enhancement pattern even on the portal venous
phase imaging.  This appears somewhat wedge-shaped and peripheral.
Areas of multi focal splenic infarction could have this appearance
possibly from an embolic process.  The tortuous main splenic artery
appears patent.

Right kidney demonstrates a lower pole wedge shaped defect
extending to the cortical margin, image 36 series 7 also suspicious
for a renal infarct, possibly embolic.  Renal arteries both remain
patent.

Negative for bowel obstruction, significant dilatation, ileus or
free air. No visualized bowel wall thickening.  colon is relatively
decompressed.  Cecum is low lying in the pelvis.  Normal appendix
identified containing air.

No abdominal or pelvic free fluid, fluid collection, hemorrhage,
hematoma, adenopathy, inguinal abnormality, or hernia.

Mild diffuse degenerative changes of the spine.  No compression
fracture.

 Review of the MIP images confirms the above findings.
IMPRESSION: Heterogeneous enhancing pattern of the spleen which persist on the
portal venous phase delayed imaging suspicious for multi focal
splenic infarcts.  Wedge shaped defect right kidney lower pole
suspicious for a renal infarct.  Finding suggest a possible embolic
phenomenon.

Small nonocclusive noncalcified plaque formation or thrombus in the
lower thoracic aorta and to a more pronounced degree in the
infrarenal abdominal aorta.

Slight narrowing of the celiac origin however the celiac, SMA and
IMA appear patent.  Single renal arteries appear patent as well.

No CT evidence of bowel obstruction, bowel wall thickening,
pneumatosis, or portal venous gas

## 2013-07-21 ENCOUNTER — Other Ambulatory Visit: Payer: Self-pay | Admitting: Family Medicine

## 2013-07-21 DIAGNOSIS — Z780 Asymptomatic menopausal state: Secondary | ICD-10-CM

## 2013-07-21 DIAGNOSIS — Z1231 Encounter for screening mammogram for malignant neoplasm of breast: Secondary | ICD-10-CM

## 2013-08-04 ENCOUNTER — Telehealth: Payer: Self-pay | Admitting: Hematology & Oncology

## 2013-08-04 NOTE — Telephone Encounter (Signed)
Patient called to cancel appt today.

## 2013-08-06 ENCOUNTER — Other Ambulatory Visit: Payer: Self-pay | Admitting: Lab

## 2013-08-06 ENCOUNTER — Ambulatory Visit: Payer: Self-pay | Admitting: Hematology & Oncology

## 2013-08-10 ENCOUNTER — Telehealth: Payer: Self-pay | Admitting: Hematology & Oncology

## 2013-08-10 NOTE — Telephone Encounter (Signed)
Received referral with abnormal labs. Pt is a regular pt with [place]. Pt is aware of 1-9 appointment and MD has seen labs is aware of follow up appointment. Pt was under impression she didn't need to come in on 12-16 if we didn't call her with lab results.

## 2013-08-24 ENCOUNTER — Telehealth: Payer: Self-pay | Admitting: Hematology & Oncology

## 2013-08-24 NOTE — Telephone Encounter (Signed)
Pt moved 1-9 to 1-28

## 2013-08-27 ENCOUNTER — Ambulatory Visit: Payer: Self-pay

## 2013-08-27 ENCOUNTER — Other Ambulatory Visit: Payer: Self-pay

## 2013-08-28 ENCOUNTER — Other Ambulatory Visit: Payer: Self-pay | Admitting: Lab

## 2013-08-28 ENCOUNTER — Ambulatory Visit: Payer: Self-pay | Admitting: Hematology & Oncology

## 2013-09-11 ENCOUNTER — Ambulatory Visit
Admission: RE | Admit: 2013-09-11 | Discharge: 2013-09-11 | Disposition: A | Discharge status: Home / Self Care | Payer: BC Managed Care – PPO | Source: Ambulatory Visit | Attending: Family Medicine | Admitting: Family Medicine

## 2013-09-11 DIAGNOSIS — Z1231 Encounter for screening mammogram for malignant neoplasm of breast: Secondary | ICD-10-CM

## 2013-09-15 ENCOUNTER — Telehealth: Payer: Self-pay | Admitting: Hematology & Oncology

## 2013-09-15 NOTE — Telephone Encounter (Signed)
Patient called and cx 09/16/13 apt due to being sick.  Patient will call back to resch

## 2013-09-16 ENCOUNTER — Other Ambulatory Visit: Payer: Self-pay | Admitting: Lab

## 2013-09-16 ENCOUNTER — Ambulatory Visit: Payer: Self-pay | Admitting: Hematology & Oncology

## 2013-09-17 ENCOUNTER — Ambulatory Visit: Payer: Self-pay | Admitting: Hematology & Oncology

## 2013-09-17 ENCOUNTER — Other Ambulatory Visit: Payer: Self-pay | Admitting: Lab

## 2013-09-23 ENCOUNTER — Telehealth: Payer: Self-pay | Admitting: Hematology & Oncology

## 2013-09-23 NOTE — Telephone Encounter (Signed)
Pt left message about appointment, I called left message for her to call.

## 2013-09-23 NOTE — Telephone Encounter (Signed)
Pt called wanting appointment said PCP told her she needed to be seen. Pt scheduled 2-10 transferred call to RN for triage

## 2013-09-29 ENCOUNTER — Encounter: Payer: Self-pay | Admitting: Hematology & Oncology

## 2013-09-29 ENCOUNTER — Ambulatory Visit (HOSPITAL_BASED_OUTPATIENT_CLINIC_OR_DEPARTMENT_OTHER): Payer: BC Managed Care – PPO | Admitting: Hematology & Oncology

## 2013-09-29 ENCOUNTER — Other Ambulatory Visit (HOSPITAL_BASED_OUTPATIENT_CLINIC_OR_DEPARTMENT_OTHER): Payer: BC Managed Care – PPO | Admitting: Lab

## 2013-09-29 VITALS — BP 107/59 | HR 95 | Temp 98.4°F | Resp 14 | Ht 67.0 in | Wt 244.0 lb

## 2013-09-29 DIAGNOSIS — D509 Iron deficiency anemia, unspecified: Secondary | ICD-10-CM

## 2013-09-29 DIAGNOSIS — I7411 Embolism and thrombosis of thoracic aorta: Secondary | ICD-10-CM

## 2013-09-29 DIAGNOSIS — I8290 Acute embolism and thrombosis of unspecified vein: Secondary | ICD-10-CM

## 2013-09-29 DIAGNOSIS — I7410 Embolism and thrombosis of unspecified parts of aorta: Secondary | ICD-10-CM

## 2013-09-29 DIAGNOSIS — D473 Essential (hemorrhagic) thrombocythemia: Secondary | ICD-10-CM

## 2013-09-29 DIAGNOSIS — D75839 Thrombocytosis, unspecified: Secondary | ICD-10-CM

## 2013-09-29 LAB — CBC WITH DIFFERENTIAL (CANCER CENTER ONLY)
BASO#: 0.1 10*3/uL (ref 0.0–0.2)
BASO%: 0.4 % (ref 0.0–2.0)
EOS%: 2.9 % (ref 0.0–7.0)
Eosinophils Absolute: 0.4 10*3/uL (ref 0.0–0.5)
HCT: 44.1 % (ref 34.8–46.6)
HGB: 15 g/dL (ref 11.6–15.9)
LYMPH#: 5.7 10*3/uL — AB (ref 0.9–3.3)
LYMPH%: 40.6 % (ref 14.0–48.0)
MCH: 31.4 pg (ref 26.0–34.0)
MCHC: 34 g/dL (ref 32.0–36.0)
MCV: 93 fL (ref 81–101)
MONO#: 0.9 10*3/uL (ref 0.1–0.9)
MONO%: 6.3 % (ref 0.0–13.0)
NEUT%: 49.8 % (ref 39.6–80.0)
NEUTROS ABS: 7 10*3/uL — AB (ref 1.5–6.5)
PLATELETS: 573 10*3/uL — AB (ref 145–400)
RBC: 4.77 10*6/uL (ref 3.70–5.32)
RDW: 13.1 % (ref 11.1–15.7)
WBC: 14.1 10*3/uL — ABNORMAL HIGH (ref 3.9–10.0)

## 2013-09-29 LAB — CHCC SATELLITE - SMEAR

## 2013-09-29 LAB — FERRITIN CHCC: FERRITIN: 101 ng/mL (ref 9–269)

## 2013-09-29 LAB — IRON AND TIBC CHCC
%SAT: 22 % (ref 21–57)
Iron: 63 ug/dL (ref 41–142)
TIBC: 281 ug/dL (ref 236–444)
UIBC: 218 ug/dL (ref 120–384)

## 2013-09-29 NOTE — Progress Notes (Signed)
Hematology and Oncology Follow Up Visit  Kristy Stewart [id number] [birth year] 58 y.o. 09/29/2013   Principle Diagnosis:  . 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 2. 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 3.  Mild Protein C deficiency  Current Therapy:    Aspirin 162 mg by mouth daily     Interim History:  Kristy Stewart is back for followup. We saw Kristy Stewart 6 months ago. She recent had the flu. She said that he should blood work done and Kristy Stewart platelet count 700,000. This is no surprise. 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 Because of this, and he stress on the body will lead to an exaggerated platelet response.  We last saw Kristy Stewart, we did do protein C levels. Kristy Stewart protein C was minimally depressed at 69%. I don't know if this would be considered a factor with Kristy Stewart having the aortic thrombus.  There is no abdominal pain. She's had no cough or shortness of breath. She has had a cycle with Kristy Stewart menstrual bleeding. She has not had one for many years. She told Kristy Stewart doctor about this. Kristy Stewart doctor told Kristy Stewart that if this happened again, then she would need to go to a gynecologist.  She's had no problems rashes. She's had no nausea vomiting. There's been no headache.  Medications: Current outpatient prescriptions:ALPRAZolam (XANAX) 0.5 MG tablet, Take 0.25 mg by mouth 2 (two) times daily as needed. For anxiety, Disp: , Rfl: ;  aspirin 81 MG tablet, Take 81 mg by mouth daily. PT TAKES 2 PO DAILY TO = 162  MG DAILY, Disp: , Rfl: ;  buPROPion (WELLBUTRIN) 75 MG tablet, Take 75 mg by mouth 2 (two) times daily.  , Disp: , Rfl: ;  Canagliflozin (INVOKANA) 100 MG TABS, Take 100 mg by mouth every morning., Disp: , Rfl:  CHANTIX STARTING MONTH PAK 0.5 MG X 11 & 1 MG X 42 tablet, Take by mouth every other day. , Disp: , Rfl: ;  cholestyramine (QUESTRAN) 4 GM/DOSE powder, Take 4 g by mouth every morning.  , Disp: , Rfl: ;  glipiZIDE (GLUCOTROL) 10 MG tablet, Take 10-20 mg by mouth 2 (two) times daily before a meal.  Take 2 tablets in the morning and 1 tablet in the evening, Disp: , Rfl: ;  GLUCOSAMINE-CHONDROITIN PO, Take 1 tablet by mouth daily., Disp: , Rfl:  hyoscyamine (LEVSIN, ANASPAZ) 0.125 MG tablet, Take 0.125 mg by mouth every morning. , Disp: , Rfl: ;  Iron 66 MG TABS, Take by mouth 3 (three) times a week., Disp: , Rfl: ;  lisinopril-hydrochlorothiazide (PRINZIDE,ZESTORETIC) 20-12.5 MG per tablet, Take 1 tablet by mouth every morning.  , Disp: , Rfl: ;  pantoprazole (PROTONIX) 40 MG tablet, Take 40 mg by mouth 2 (two) times daily., Disp: , Rfl:  sitaGLIPtin (JANUVIA) 100 MG tablet, Take 100 mg by mouth daily., Disp: , Rfl: ;  traZODone (DESYREL) 100 MG tablet, Take 100 mg by mouth at bedtime.  , Disp: , Rfl: ;  triamcinolone lotion (KENALOG) 0.1 %, Apply 1 application topically as needed. , Disp: , Rfl:   Allergies:  Allergies  Allergen Reactions  . Codeine Other (See Comments)    Violently ill; reports having taken Vicodin previously without problems  . Erythromycin Nausea And Vomiting  . Lactose Intolerance (Gi) Other (See Comments)    GI upset  . Penicillins Nausea Only    Past Medical History, Surgical history, Social history, and Family History were reviewed and updated.  Review of Systems: As above  Physical Exam:  height  is 5\' 7"  (1.702 m) and weight is 244 lb (110.678 kg). Kristy Stewart oral temperature is 98.4 F (36.9 C). Kristy Stewart blood pressure is 107/59 and Kristy Stewart pulse is 95. Kristy Stewart respiration is 14.   This is an obese white female in no obvious distress. Kristy Stewart head and exam shows doctor who oral lesions. She has no adenopathy in the neck. Lungs are clear bilaterally. Cardiac exam regular rate and rhythm with no murmurs rubs or bruits. Abdomen is soft. She is well-healed splenectomy scar. There is no fluid wave. There is no palpable hepato- megaly. Extremities shows no clubbing cyanosis or edema. She has no venous cord in Kristy Stewart legs. She has good range of motion of Kristy Stewart joints. Skin exam no rashes  ecchymosis or petechia. Neurological exam no focal neurological deficits.  Lab Results  Component Value Date   WBC 14.1* 09/29/2013   HGB 15.0 09/29/2013   HCT 44.1 09/29/2013   MCV 93 09/29/2013   PLT 573* 09/29/2013     Chemistry      Component Value Date/Time   NA 132* 12/23/2011 0330   K 3.7 12/23/2011 0330   CL 99 12/23/2011 0330   CO2 22 12/23/2011 0330   BUN 6 12/23/2011 0330   CREATININE 0.82 12/23/2011 0330      Component Value Date/Time   CALCIUM 8.4 12/23/2011 0330   ALKPHOS 161* 12/22/2011 0430   AST 10 12/22/2011 0430   ALT <5 12/22/2011 0430   BILITOT 0.2* 12/22/2011 0430      I will get Kristy Stewart blood smear. Kristy Stewart platelet count is elevated. She has several large platelets. This will Granit. White cells are mildly increased in number. Most of the white cells were polys. I do not see any immature myeloid or lymphoid cells.     Impression and Plan: Kristy Stewart is a 58 year old white female. She has a history of an aortic thrombus. She had this a couple years ago. I think this happened in January of 2013.  I don't see any issues with respect to Kristy Stewart having another thrombotic episode.  I don't think that the mild protein C deficiency is a factor.  For now, we will just follow Kristy Stewart along. I will plan to get Kristy Stewart back to see [place] in another 6 months.  Volanda Napoleon, MD 2/10/201510:16 AM

## 2014-03-29 ENCOUNTER — Ambulatory Visit: Payer: Self-pay | Admitting: Hematology & Oncology

## 2014-03-29 ENCOUNTER — Other Ambulatory Visit: Payer: Self-pay | Admitting: Lab

## 2014-03-30 ENCOUNTER — Ambulatory Visit (HOSPITAL_BASED_OUTPATIENT_CLINIC_OR_DEPARTMENT_OTHER): Payer: BC Managed Care – PPO | Admitting: Hematology & Oncology

## 2014-03-30 ENCOUNTER — Other Ambulatory Visit (HOSPITAL_BASED_OUTPATIENT_CLINIC_OR_DEPARTMENT_OTHER): Payer: BC Managed Care – PPO | Admitting: Lab

## 2014-03-30 VITALS — BP 107/72 | HR 101 | Temp 97.0°F | Resp 20 | Ht 68.0 in | Wt 222.0 lb

## 2014-03-30 DIAGNOSIS — D473 Essential (hemorrhagic) thrombocythemia: Secondary | ICD-10-CM

## 2014-03-30 DIAGNOSIS — I7410 Embolism and thrombosis of unspecified parts of aorta: Secondary | ICD-10-CM

## 2014-03-30 DIAGNOSIS — D75839 Thrombocytosis, unspecified: Secondary | ICD-10-CM

## 2014-03-30 LAB — CBC WITH DIFFERENTIAL (CANCER CENTER ONLY)
BASO#: 0 10*3/uL (ref 0.0–0.2)
BASO%: 0.4 % (ref 0.0–2.0)
EOS ABS: 0.2 10*3/uL (ref 0.0–0.5)
EOS%: 2.2 % (ref 0.0–7.0)
HCT: 47 % — ABNORMAL HIGH (ref 34.8–46.6)
HEMOGLOBIN: 16.6 g/dL — AB (ref 11.6–15.9)
LYMPH#: 3.5 10*3/uL — ABNORMAL HIGH (ref 0.9–3.3)
LYMPH%: 31.9 % (ref 14.0–48.0)
MCH: 32.2 pg (ref 26.0–34.0)
MCHC: 35.3 g/dL (ref 32.0–36.0)
MCV: 91 fL (ref 81–101)
MONO#: 0.7 10*3/uL (ref 0.1–0.9)
MONO%: 6.6 % (ref 0.0–13.0)
NEUT%: 58.9 % (ref 39.6–80.0)
NEUTROS ABS: 6.5 10*3/uL (ref 1.5–6.5)
Platelets: 571 10*3/uL — ABNORMAL HIGH (ref 145–400)
RBC: 5.15 10*6/uL (ref 3.70–5.32)
RDW: 13.6 % (ref 11.1–15.7)
WBC: 10.9 10*3/uL — ABNORMAL HIGH (ref 3.9–10.0)

## 2014-03-30 LAB — TECHNOLOGIST REVIEW CHCC SATELLITE

## 2014-03-30 NOTE — Progress Notes (Signed)
Hematology and Oncology Follow Up Visit  Kristy Stewart [id number] 1956/02/03 58 y.o. 03/30/2014   Principle Diagnosis:  1. Aortic thrombosis. 2. 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 3.  Mild Protein C deficiency  Current Therapy:    Aspirin 162 mg by mouth daily     Interim History:  Ms.  Stewart is back for followup. She's doing very well. We last saw her back in February.  This is a, and she is on a new medication for her diabetes. She says that her blood sugars are doing better. She still working. She's not having any problems with work. There is been no bleeding. She says it has been some spotting. This is unusual for her. I think she probably needs to see her gynecologist for this. I am not sure who her gynecologist is.  She's had no problems bowels or bladder. She's had no leg swelling. She's had no rashes. She's had no problems with infections.  There has been no cough. She has had no shortness of breath.  Medications: Current outpatient prescriptions:ALPRAZolam (XANAX) 0.5 MG tablet, Take 0.25 mg by mouth 2 (two) times daily as needed. For anxiety, Disp: , Rfl: ;  aspirin 81 MG tablet, Take 81 mg by mouth daily. PT TAKES 2 PO DAILY TO = 162  MG DAILY, Disp: , Rfl: ;  buPROPion (WELLBUTRIN) 75 MG tablet, Take 150 mg by mouth daily. , Disp: , Rfl: ;  Canagliflozin (INVOKANA) 100 MG TABS, Take 100 mg by mouth every morning., Disp: , Rfl:  CHANTIX STARTING MONTH PAK 0.5 MG X 11 & 1 MG X 42 tablet, Take by mouth every other day. , Disp: , Rfl: ;  cholestyramine (QUESTRAN) 4 GM/DOSE powder, Take 4 g by mouth every morning.  , Disp: , Rfl: ;  glipiZIDE (GLUCOTROL) 10 MG tablet, Take 10-20 mg by mouth 2 (two) times daily before a meal. Take 2 tablets in the morning and 1 tablet in the evening, Disp: , Rfl: ;  GLUCOSAMINE-CHONDROITIN PO, Take 1 tablet by mouth daily., Disp: , Rfl:  hyoscyamine (LEVSIN, ANASPAZ) 0.125 MG tablet, Take 0.125 mg by mouth every  morning. , Disp: , Rfl: ;  Iron 66 MG TABS, Take by mouth 4 (four) times a week. , Disp: , Rfl: ;  lisinopril-hydrochlorothiazide (PRINZIDE,ZESTORETIC) 20-12.5 MG per tablet, Take 1 tablet by mouth every morning.  , Disp: , Rfl: ;  pantoprazole (PROTONIX) 40 MG tablet, Take 40 mg by mouth 2 (two) times daily., Disp: , Rfl:  promethazine (PHENERGAN) 25 MG tablet, Take 25 mg by mouth as needed., Disp: , Rfl: ;  sitaGLIPtin (JANUVIA) 100 MG tablet, Take 100 mg by mouth daily., Disp: , Rfl: ;  traZODone (DESYREL) 100 MG tablet, Take 100 mg by mouth at bedtime.  , Disp: , Rfl: ;  triamcinolone lotion (KENALOG) 0.1 %, Apply 1 application topically as needed. , Disp: , Rfl:   Allergies:  Allergies  Allergen Reactions  . Codeine Other (See Comments)    Violently ill; reports having taken Vicodin previously without problems  . Erythromycin Nausea And Vomiting  . Lactose Intolerance (Gi) Other (See Comments)    GI upset  . Penicillins Nausea Only    Past Medical History, Surgical history, Social history, and Family History were reviewed and updated.  Review of Systems: As above  Physical Exam:  height is 5\' 8"  (1.727 m) and weight is 222 lb (100.699 kg). Her oral temperature is 97 F (36.1 C). Her blood pressure  is 107/72 and her pulse is 101. Her respiration is 20.   Well developed and well-nourished white female. Head and neck exam is no ocular or oral lesions. There are no palpable cervical or supraclavicular lymph nodes. Lungs are clear bilaterally. Cardiac exam regular in rhythm with no murmurs rubs or bruits. Abdomen soft. She is well-healed splenectomy scar. There is no fluid wave. There is a palpable liver edge. Neck exam shows no tenderness over the spine ribs or hips. Extremities shows no clubbing cyanosis or edema. She has good range motion of her joints. Skin exam no rashes, ecchymosis or petechia.  Lab Results  Component Value Date   WBC 10.9* 03/30/2014   HGB 16.6* 03/30/2014   HCT  47.0* 03/30/2014   MCV 91 03/30/2014   PLT 571* 03/30/2014     Chemistry      Component Value Date/Time   NA 132* 12/23/2011 0330   K 3.7 12/23/2011 0330   CL 99 12/23/2011 0330   CO2 22 12/23/2011 0330   BUN 6 12/23/2011 0330   CREATININE 0.82 12/23/2011 0330      Component Value Date/Time   CALCIUM 8.4 12/23/2011 0330   ALKPHOS 161* 12/22/2011 0430   AST 10 12/22/2011 0430   ALT <5 12/22/2011 0430   BILITOT 0.2* 12/22/2011 0430         Impression and Plan: Kristy Stewart is a 58 year old white female with a history of an aortic thrombus. The only possible etiology is a mild protein C deficiency. Again, it is hard to say how much this really was a factor.  She's doing well on aspirin.  I don't see anything that would have to do for her right now.  Repolishing get her back in one year now. We certainly can get her back sooner if she has any problems.   Volanda Napoleon, MD 8/11/201511:12 AM

## 2014-03-31 LAB — PROTEIN C, TOTAL: Protein C, Total: 100 % (ref 72–160)

## 2014-03-31 LAB — D-DIMER, QUANTITATIVE: D-Dimer, Quant: 0.38 ug/mL-FEU (ref 0.00–0.48)

## 2014-03-31 LAB — PROTEIN C ACTIVITY: Protein C Activity: 171 % — ABNORMAL HIGH (ref 75–133)

## 2014-06-28 ENCOUNTER — Encounter: Payer: Self-pay | Admitting: *Deleted

## 2014-06-28 ENCOUNTER — Telehealth: Payer: Self-pay | Admitting: Hematology & Oncology

## 2014-06-28 ENCOUNTER — Telehealth: Payer: Self-pay | Admitting: *Deleted

## 2014-06-28 NOTE — Telephone Encounter (Signed)
Pt is scheduled for a hysteroscopy and D&C this Friday. Her surgeon told her to go off aspirin for 5 days prior to surgery. Patient called wanting [place] to talk to Dr Marin Olp to make sure this was okay to do. Spoke to Dr Marin Olp and he said it was okay to follow surgeons orders.

## 2014-06-28 NOTE — Telephone Encounter (Signed)
Faxed medical records to: [facility]  F: [phone] P: [phone]      COPY SCANNED

## 2014-07-02 ENCOUNTER — Other Ambulatory Visit: Payer: Self-pay

## 2015-03-29 ENCOUNTER — Ambulatory Visit (HOSPITAL_BASED_OUTPATIENT_CLINIC_OR_DEPARTMENT_OTHER): Payer: BLUE CROSS/BLUE SHIELD | Admitting: Family

## 2015-03-29 ENCOUNTER — Other Ambulatory Visit (HOSPITAL_BASED_OUTPATIENT_CLINIC_OR_DEPARTMENT_OTHER): Payer: BLUE CROSS/BLUE SHIELD

## 2015-03-29 VITALS — BP 114/70 | HR 77 | Temp 98.4°F | Resp 18 | Ht 68.0 in | Wt 196.8 lb

## 2015-03-29 DIAGNOSIS — D509 Iron deficiency anemia, unspecified: Secondary | ICD-10-CM

## 2015-03-29 DIAGNOSIS — D473 Essential (hemorrhagic) thrombocythemia: Secondary | ICD-10-CM

## 2015-03-29 DIAGNOSIS — Z86718 Personal history of other venous thrombosis and embolism: Secondary | ICD-10-CM | POA: Diagnosis not present

## 2015-03-29 DIAGNOSIS — D75839 Thrombocytosis, unspecified: Secondary | ICD-10-CM

## 2015-03-29 DIAGNOSIS — I8290 Acute embolism and thrombosis of unspecified vein: Secondary | ICD-10-CM

## 2015-03-29 LAB — COMPREHENSIVE METABOLIC PANEL
ALBUMIN: 4 g/dL (ref 3.6–5.1)
ALT: 5 U/L — AB (ref 6–29)
AST: 10 U/L (ref 10–35)
Alkaline Phosphatase: 66 U/L (ref 33–130)
BUN: 15 mg/dL (ref 7–25)
CHLORIDE: 102 mmol/L (ref 98–110)
CO2: 24 mmol/L (ref 20–31)
Calcium: 9.1 mg/dL (ref 8.6–10.4)
Creatinine, Ser: 0.93 mg/dL (ref 0.50–1.05)
Glucose, Bld: 129 mg/dL — ABNORMAL HIGH (ref 65–99)
Potassium: 4 mmol/L (ref 3.5–5.3)
SODIUM: 135 mmol/L (ref 135–146)
Total Bilirubin: 0.4 mg/dL (ref 0.2–1.2)
Total Protein: 6.2 g/dL (ref 6.1–8.1)

## 2015-03-29 LAB — CBC WITH DIFFERENTIAL (CANCER CENTER ONLY)
BASO#: 0.1 10*3/uL (ref 0.0–0.2)
BASO%: 0.3 % (ref 0.0–2.0)
EOS%: 1.7 % (ref 0.0–7.0)
Eosinophils Absolute: 0.3 10*3/uL (ref 0.0–0.5)
HCT: 43.1 % (ref 34.8–46.6)
HEMOGLOBIN: 15.4 g/dL (ref 11.6–15.9)
LYMPH#: 5.4 10*3/uL — ABNORMAL HIGH (ref 0.9–3.3)
LYMPH%: 35.1 % (ref 14.0–48.0)
MCH: 33.2 pg (ref 26.0–34.0)
MCHC: 35.7 g/dL (ref 32.0–36.0)
MCV: 93 fL (ref 81–101)
MONO#: 1.1 10*3/uL — ABNORMAL HIGH (ref 0.1–0.9)
MONO%: 7 % (ref 0.0–13.0)
NEUT#: 8.6 10*3/uL — ABNORMAL HIGH (ref 1.5–6.5)
NEUT%: 55.9 % (ref 39.6–80.0)
Platelets: 530 10*3/uL — ABNORMAL HIGH (ref 145–400)
RBC: 4.64 10*6/uL (ref 3.70–5.32)
RDW: 13.7 % (ref 11.1–15.7)
WBC: 15.4 10*3/uL — ABNORMAL HIGH (ref 3.9–10.0)

## 2015-03-29 LAB — IRON AND TIBC CHCC
%SAT: 41 % (ref 21–57)
IRON: 113 ug/dL (ref 41–142)
TIBC: 280 ug/dL (ref 236–444)
UIBC: 167 ug/dL (ref 120–384)

## 2015-03-29 LAB — FERRITIN CHCC: FERRITIN: 114 ng/mL (ref 9–269)

## 2015-03-29 LAB — CHCC SATELLITE - SMEAR

## 2015-03-29 NOTE — Progress Notes (Signed)
Hematology and Oncology Follow Up Visit  Kristy Stewart [id number] [birth year] 59 y.o. 03/29/2015   Principle Diagnosis:  1. History of ortic thrombosis. 2. Thrombocytosis - secondary to iron deficiency and splenectomy. 3. Mild Protein C deficiency  Current Therapy:   Aspirin 162 mg by mouth daily    Interim History:  Kristy Stewart is here today for a follow-up. She is doing well and has no complaints at this time. She has had no problems with abdominal pain. No infections.  She denies fever, chills, n/v, cough, rash, dizziness, headaches, blurred vision, SOB, chest pain, palpitations, changes in bowel or bladder habits. No blood in her urine or stool.  She has done well with 2 baby aspirin daily.  Her blood sugars are well controlled. She stopped Januvia and started Invokana. This helped with her energy level and she was able to begin working out. She has lost almost 50 lbs in 1 year and is feeling so much better. She is also watching her diet closely. She makes sure to stay well hydrated.  She has no swelling, tenderness, numbness or tingling in her extremities. No new aches or pains.   Medications:    Medication List       This list is accurate as of: 03/29/15  8:50 AM.  Always use your most recent med list.               ALPRAZolam 0.5 MG tablet  Commonly known as:  XANAX  Take 0.25 mg by mouth 2 (two) times daily as needed. For anxiety     aspirin 81 MG tablet  Take 81 mg by mouth daily. PT TAKES 2 PO DAILY TO = 162  MG DAILY     buPROPion 75 MG tablet  Commonly known as:  WELLBUTRIN  Take 150 mg by mouth daily.     CHANTIX STARTING MONTH PAK 0.5 MG X 11 & 1 MG X 42 tablet  Generic drug:  varenicline  Take by mouth every other day.     cholestyramine 4 GM/DOSE powder  Commonly known as:  QUESTRAN  Take 4 g by mouth every morning.     glipiZIDE 10 MG tablet  Commonly known as:  GLUCOTROL  Take 10-20 mg by mouth 2 (two) times daily before a meal. Take 2 tablets in  the morning and 1 tablet in the evening     GLUCOSAMINE-CHONDROITIN PO  Take 1 tablet by mouth daily.     hyoscyamine 0.125 MG tablet  Commonly known as:  LEVSIN, ANASPAZ  Take 0.125 mg by mouth every morning.     INVOKANA 100 MG Tabs tablet  Generic drug:  canagliflozin  Take 100 mg by mouth every morning.     Iron 66 MG Tabs  Take by mouth 4 (four) times a week.     lisinopril-hydrochlorothiazide 20-12.5 MG per tablet  Commonly known as:  PRINZIDE,ZESTORETIC  Take 1 tablet by mouth every morning.     pantoprazole 40 MG tablet  Commonly known as:  PROTONIX  Take 40 mg by mouth 2 (two) times daily.     promethazine 25 MG tablet  Commonly known as:  PHENERGAN  Take 25 mg by mouth as needed.     sitaGLIPtin 100 MG tablet  Commonly known as:  JANUVIA  Take 100 mg by mouth daily.     traZODone 100 MG tablet  Commonly known as:  DESYREL  Take 100 mg by mouth at bedtime.     triamcinolone lotion 0.1 %  Commonly known as:  KENALOG  Apply 1 application topically as needed.        Allergies:  Allergies  Allergen Reactions  . Codeine Other (See Comments)    Violently ill; reports having taken Vicodin previously without problems  . Erythromycin Nausea And Vomiting  . Lactose Intolerance (Gi) Other (See Comments)    GI upset  . Penicillins Nausea Only    Past Medical History, Surgical history, Social history, and Family History were reviewed and updated.  Review of Systems: All other 10 point review of systems is negative.   Physical Exam:  vitals were not taken for this visit.  Wt Readings from Last 3 Encounters:  03/30/14 222 lb (100.699 kg)  09/29/13 244 lb (110.678 kg)  03/19/13 248 lb (112.492 kg)    Ocular: Sclerae unicteric, pupils equal, round and reactive to light Ear-nose-throat: Oropharynx clear, dentition fair Lymphatic: No cervical or supraclavicular adenopathy Lungs no rales or rhonchi, good excursion bilaterally Heart regular rate and rhythm,  no murmur appreciated Abd soft, nontender, positive bowel sounds MSK no focal spinal tenderness, no joint edema Neuro: non-focal, well-oriented, appropriate affect Breasts: Deferred  Lab Results  Component Value Date   WBC 15.4* 03/29/2015   HGB 15.4 03/29/2015   HCT 43.1 03/29/2015   MCV 93 03/29/2015   PLT 530* 03/29/2015   Lab Results  Component Value Date   FERRITIN 101 09/29/2013   IRON 63 09/29/2013   TIBC 281 09/29/2013   UIBC 218 09/29/2013   IRONPCTSAT 22 09/29/2013   Lab Results  Component Value Date   RBC 4.64 03/29/2015   No results found for: KPAFRELGTCHN, LAMBDASER, KAPLAMBRATIO No results found for: IGGSERUM, IGA, IGMSERUM No results found for: Ronnald Ramp, A1GS, A2GS, Violet Baldy, MSPIKE, SPEI   Chemistry      Component Value Date/Time   NA 132* 12/23/2011 0330   K 3.7 12/23/2011 0330   CL 99 12/23/2011 0330   CO2 22 12/23/2011 0330   BUN 6 12/23/2011 0330   CREATININE 0.82 12/23/2011 0330      Component Value Date/Time   CALCIUM 8.4 12/23/2011 0330   ALKPHOS 161* 12/22/2011 0430   AST 10 12/22/2011 0430   ALT <5 12/22/2011 0430   BILITOT 0.2* 12/22/2011 0430     Impression and Plan: Kristy Stewart is a 59 year old white female with a history of an aortic thrombus and mild protein C deficiency. She is doing well on aspirin and has had no recurrent thrombus.  Her platelet count is coming down nicely at 530. There have been no episodes of bleeding or bruising.  We will plan to see her back in 1 year for labs and follow-up.  She knows to contact us with any questions or concerns. We can certainly see her sooner if need be.   Eliezer Bottom, NP 8/9/20168:50 AM

## 2015-03-30 ENCOUNTER — Telehealth: Payer: Self-pay | Admitting: *Deleted

## 2015-03-30 NOTE — Telephone Encounter (Signed)
Called and left a  voicemail on patient personal voicemail.

## 2015-03-30 NOTE — Telephone Encounter (Signed)
-----   Message from Volanda Napoleon, MD sent at 03/30/2015  6:31 AM EDT ----- Call - iron level is ok!  pete

## 2016-03-27 ENCOUNTER — Ambulatory Visit: Payer: Self-pay | Admitting: Hematology & Oncology

## 2016-03-27 ENCOUNTER — Other Ambulatory Visit: Payer: Self-pay

## 2016-05-03 ENCOUNTER — Ambulatory Visit: Payer: Self-pay | Admitting: Hematology & Oncology

## 2016-05-03 ENCOUNTER — Other Ambulatory Visit: Payer: Self-pay

## 2020-11-24 ENCOUNTER — Other Ambulatory Visit (HOSPITAL_COMMUNITY): Payer: Self-pay | Admitting: Gastroenterology

## 2020-11-24 ENCOUNTER — Other Ambulatory Visit: Payer: Self-pay | Admitting: Gastroenterology

## 2020-11-24 DIAGNOSIS — R1011 Right upper quadrant pain: Secondary | ICD-10-CM

## 2020-12-07 ENCOUNTER — Encounter (HOSPITAL_COMMUNITY)
Admission: RE | Admit: 2020-12-07 | Discharge: 2020-12-07 | Disposition: A | Discharge status: Home / Self Care | Payer: BC Managed Care – PPO | Source: Ambulatory Visit | Attending: Gastroenterology | Admitting: Gastroenterology

## 2020-12-07 DIAGNOSIS — R1011 Right upper quadrant pain: Secondary | ICD-10-CM | POA: Diagnosis present

## 2020-12-19 ENCOUNTER — Encounter (HOSPITAL_COMMUNITY): Payer: BC Managed Care – PPO

## 2020-12-19 ENCOUNTER — Encounter (HOSPITAL_COMMUNITY): Payer: Self-pay

## 2022-10-19 DEATH — deceased
